# Patient Record
Sex: Male | Born: 1965
Health system: Southern US, Community
[De-identification: ages and names within clinical notes are randomized; demographics above are authoritative.]

## PROBLEM LIST (undated history)

## (undated) DIAGNOSIS — R252 Cramp and spasm: Secondary | ICD-10-CM

## (undated) DIAGNOSIS — G44009 Cluster headache syndrome, unspecified, not intractable: Secondary | ICD-10-CM

## (undated) DIAGNOSIS — K900 Celiac disease: Secondary | ICD-10-CM

## (undated) DIAGNOSIS — B019 Varicella without complication: Secondary | ICD-10-CM

## (undated) DIAGNOSIS — T7840XA Allergy, unspecified, initial encounter: Secondary | ICD-10-CM

## (undated) DIAGNOSIS — M79642 Pain in left hand: Secondary | ICD-10-CM

## (undated) DIAGNOSIS — Z8619 Personal history of other infectious and parasitic diseases: Secondary | ICD-10-CM

## (undated) DIAGNOSIS — F32A Depression, unspecified: Secondary | ICD-10-CM

## (undated) DIAGNOSIS — F329 Major depressive disorder, single episode, unspecified: Secondary | ICD-10-CM

## (undated) DIAGNOSIS — E785 Hyperlipidemia, unspecified: Secondary | ICD-10-CM

## (undated) DIAGNOSIS — Z0000 Encounter for general adult medical examination without abnormal findings: Secondary | ICD-10-CM

## (undated) DIAGNOSIS — Z800 Family history of malignant neoplasm of digestive organs: Secondary | ICD-10-CM

## (undated) HISTORY — DX: Pain in left hand: M79.642

## (undated) HISTORY — DX: Allergy, unspecified, initial encounter: T78.40XA

## (undated) HISTORY — DX: Celiac disease: K90.0

## (undated) HISTORY — DX: Cramp and spasm: R25.2

## (undated) HISTORY — PX: TONSILLECTOMY AND ADENOIDECTOMY: SUR1326

## (undated) HISTORY — DX: Depression, unspecified: F32.A

## (undated) HISTORY — DX: Major depressive disorder, single episode, unspecified: F32.9

## (undated) HISTORY — DX: Varicella without complication: B01.9

## (undated) HISTORY — DX: Family history of malignant neoplasm of digestive organs: Z80.0

## (undated) HISTORY — DX: Cluster headache syndrome, unspecified, not intractable: G44.009

## (undated) HISTORY — DX: Encounter for general adult medical examination without abnormal findings: Z00.00

## (undated) HISTORY — DX: Personal history of other infectious and parasitic diseases: Z86.19

## (undated) HISTORY — DX: Hyperlipidemia, unspecified: E78.5

---

## 2009-02-11 ENCOUNTER — Encounter: Payer: Self-pay | Admitting: Family Medicine

## 2009-08-20 HISTORY — PX: ACHILLES TENDON SURGERY: SHX542

## 2009-09-08 ENCOUNTER — Emergency Department (HOSPITAL_COMMUNITY): Admission: EM | Admit: 2009-09-08 | Discharge: 2009-09-08 | Payer: Self-pay | Admitting: Emergency Medicine

## 2010-03-07 ENCOUNTER — Ambulatory Visit: Payer: Self-pay | Admitting: Family Medicine

## 2010-03-07 DIAGNOSIS — J309 Allergic rhinitis, unspecified: Secondary | ICD-10-CM | POA: Insufficient documentation

## 2010-03-07 DIAGNOSIS — G44009 Cluster headache syndrome, unspecified, not intractable: Secondary | ICD-10-CM

## 2010-03-07 LAB — CONVERTED CEMR LAB
Ketones, urine, test strip: NEGATIVE
Specific Gravity, Urine: 1.025
pH: 5

## 2010-03-10 ENCOUNTER — Ambulatory Visit: Payer: Self-pay | Admitting: Family Medicine

## 2010-03-10 LAB — CONVERTED CEMR LAB
AST: 23 units/L (ref 0–37)
Alkaline Phosphatase: 41 units/L (ref 39–117)
Basophils Relative: 0.5 % (ref 0.0–3.0)
Chloride: 104 meq/L (ref 96–112)
Direct LDL: 195.1 mg/dL
Eosinophils Relative: 1.1 % (ref 0.0–5.0)
GFR calc non Af Amer: 90.93 mL/min (ref >60)
Glucose, Bld: 89 mg/dL (ref 70–99)
HCT: 40.3 % (ref 39.0–52.0)
MCV: 93.6 fL (ref 78.0–100.0)
Monocytes Absolute: 0.6 10*3/uL (ref 0.1–1.0)
Neutro Abs: 2.1 10*3/uL (ref 1.4–7.7)
Phosphorus: 4 mg/dL (ref 2.3–4.6)
Potassium: 5 meq/L (ref 3.5–5.1)
RBC: 4.31 M/uL (ref 4.22–5.81)
Sodium: 141 meq/L (ref 135–145)
Total Bilirubin: 0.6 mg/dL (ref 0.3–1.2)
Triglycerides: 114 mg/dL (ref 0.0–149.0)
VLDL: 22.8 mg/dL (ref 0.0–40.0)
WBC: 4.8 10*3/uL (ref 4.5–10.5)

## 2010-08-19 NOTE — Letter (Signed)
Summary: Physician's Examination of Adoption Applicant  Physician's Examination of Adoption Applicant   Imported By: Maryln Gottron 03/14/2010 12:48:00  _____________________________________________________________________  External Attachment:    Type:   Image     Comment:   External Document

## 2010-08-19 NOTE — Assessment & Plan Note (Signed)
Summary: TB READ/cb  Nurse Visit   PPD Results    Date of reading: 03/10/2010    Results: < 5mm    Interpretation: negative

## 2010-08-19 NOTE — Letter (Signed)
Summary: Records from Dr. Hilda Lias 2006 - 2010  Records from Dr. Hilda Lias 2006 - 2010   Imported By: Maryln Gottron 04/10/2010 15:03:10  _____________________________________________________________________  External Attachment:    Type:   Image     Comment:   External Document

## 2010-08-19 NOTE — Assessment & Plan Note (Signed)
Summary: NEW PT EST (REQ CPX - WILL COME IN FASTING) // RS   Vital Signs:  Patient profile:   45 year old male Height:      68.75 inches (174.63 cm) Weight:      166 pounds (75.45 kg) BMI:     24.78 O2 Sat:      98 % on Room air Temp:     98.1 degrees F (36.72 degrees C) oral Pulse rate:   77 / minute BP sitting:   118 / 86  (left arm) Cuff size:   regular  Vitals Entered By: Josph Macho RMA (March 07, 2010 8:04 AM)  O2 Flow:  Room air CC: Establish new pt/ Requesting a physical/ CF Is Patient Diabetic? No   History of Present Illness: Patient in for initial new patient appointment. No recent illness/f/c/CP/palp/SOB/GI or GU c/o. Has a Cluster HA but none recently. Has a h/o deviated spetum, broken nose and allergies so has trouble with breathing at times but none recently. He needs a doption PE forms filled out today. He and his wife have a 59 yo dughter and have had trouble conceiving again and are hoping to adopt.  Preventive Screening-Counseling & Management  Alcohol-Tobacco     Smoking Status: never      Drug Use:  no.    Current Medications (verified): 1)  None  Allergies (verified): No Known Drug Allergies  Past History:  Past Surgical History: Tonsillectomy & Adenoidectomy Left Achilles Repair, total rupture  Family History: Father: deceased near 27, MVA, colon CA in 46s, CAD, hyperlipidemia, HTN, smoker, pulmonary, neuropathy Mother: 64, A&W, hyperlipidemia Siblings:  P1/2 Brother: 24, A&W P1/2 Sister: 33, DM, obese, former smoker or drinker M1/2 Sister: 78, A&W, quit cig P1/2 Sister: 28, DM, HTN, hyperlipidemia MGM: deceased@90 , hip fracture, CAD, hyperlipidemia MGF: deceased@74 , multiple strokes, HTN, CAD, smoker, alcohol PGM: deceased older PGF: deceased older Children: Daughter: 6, A&W  Social History: Occupation: Water engineer, Museum/gallery conservator Married  Never Smoked, rare use as youngster Drug use-no uses seat belts Alcohol use-yes,  roughly 1 daily Occupation:  employed Smoking Status:  never Drug Use:  no  Review of Systems  The patient denies anorexia, fever, weight loss, weight gain, vision loss, decreased hearing, hoarseness, chest pain, syncope, dyspnea on exertion, peripheral edema, prolonged cough, headaches, hemoptysis, abdominal pain, melena, hematochezia, severe indigestion/heartburn, hematuria, incontinence, muscle weakness, suspicious skin lesions, transient blindness, difficulty walking, depression, unusual weight change, abnormal bleeding, and enlarged lymph nodes.    Physical Exam  General:  Well-developed,well-nourished,in no acute distress; alert,appropriate and cooperative throughout examination Head:  Normocephalic and atraumatic without obvious abnormalities. No apparent alopecia or balding. Eyes:  No corneal or conjunctival inflammation noted. EOMI. Perrla. Funduscopic exam benign, without hemorrhages, exudates or papilledema. Vision grossly normal. Ears:  External ear exam shows no significant lesions or deformities.  Otoscopic examination reveals clear canals, tympanic membranes are intact bilaterally without bulging, retraction, inflammation or discharge. Hearing is grossly normal bilaterally. Nose:  External nasal examination shows no deformity or inflammation. Nasal mucosa are pink and moist without lesions or exudates. Mouth:  Oral mucosa and oropharynx without lesions or exudates.  Teeth in good repair. Neck:  No deformities, masses, or tenderness noted. Chest Wall:  No deformities, masses, tenderness or gynecomastia noted. Breasts:  No masses or gynecomastia noted Lungs:  Normal respiratory effort, chest expands symmetrically. Lungs are clear to auscultation, no crackles or wheezes. Heart:  Normal rate and regular rhythm. S1 and S2 normal without gallop, murmur,  click, rub or other extra sounds. Abdomen:  Bowel sounds positive,abdomen soft and non-tender without masses, organomegaly or hernias  noted. Msk:  No deformity or scoliosis noted of thoracic or lumbar spine.   Extremities:  No clubbing, cyanosis, edema, or deformity noted with normal full range of motion of all joints.   Neurologic:  No cranial nerve deficits noted. Station and gait are normal. Plantar reflexes are down-going bilaterally. DTRs are symmetrical throughout. Sensory, motor and coordinative functions appear intact. Skin:  Intact without suspicious lesions or rashes Cervical Nodes:  No lymphadenopathy noted Psych:  Cognition and judgment appear intact. Alert and cooperative with normal attention span and concentration. No apparent delusions, illusions, hallucinations   Impression & Recommendations:  Problem # 1:  Preventive Health Care (ICD-V70.0)  Orders: TLB-Renal Function Panel (80069-RENAL) TLB-CBC Platelet - w/Differential (85025-CBCD) TLB-Hepatic/Liver Function Pnl (80076-HEPATIC) TLB-TSH (Thyroid Stimulating Hormone) (84443-TSH) TLB-Lipid Panel (80061-LIPID) Venipuncture (60454) Specimen Handling (09811) UA Dipstick w/o Micro (automated)  (81003) Forms filled out for Adoption process.  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 Counselled regarding diet, exercise and adequate sleep  Other Orders: TB Skin Test [id number]) Admin 1st Vaccine (29562)  Patient Instructions: 1)  Please schedule a follow-up appointment in 1 year for annual 2)  BMP prior to visit, ICD-9: V70.0 for all, exc lipids 3)  Hepatic Panel prior to visit ICD-9:  4)  Lipid panel prior to visit ICD-9 : 272.2 5)  TSH prior to visit ICD-9 :  6)  CBC w/ Diff prior to visit ICD-9 :  7)  Urine- dip prior to visit ICD-9 :  8)  Consider Benefiber or Metamucil  supplement daily  Preventive Care Screening  Hemoccult:    Date:  07/21/2007    Results:  historical     Orders Added: 1)  TLB-Renal Function Panel [80069-RENAL] 2)  TLB-CBC Platelet - w/Differential [85025-CBCD] 3)  TLB-Hepatic/Liver Function Pnl [80076-HEPATIC] 4)  TLB-TSH (Thyroid Stimulating Hormone) [84443-TSH] 5)  TLB-Lipid Panel [80061-LIPID] 6)  TB Skin Test [86580] 7)  Admin 1st Vaccine [90471] 8)  Venipuncture [13086] 9)  Specimen Handling [99000] 10)  UA Dipstick w/o Micro (automated)  [81003] 11)  New Patient 40-64 years [99386]    Immunizations Administered:  PPD Skin Test:    Vaccine Type: PPD    Site: left forearm    Mfr: Sanofi Pasteur    Dose: 0.1 ml    Route: ID    Given by: Josph Macho RMA    Exp. Date: 05/22/2011    Lot #: [id number]   Laboratory Results   Urine Tests  Date/Time Received: March 07, 2010   Routine Urinalysis   Color: yellow Appearance: Clear Glucose: negative   (Normal Range: Negative) Bilirubin: negative   (Normal Range: Negative) Ketone: negative   (Normal Range: Negative) Spec. Gravity: 1.025   (Normal Range: 1.003-1.035) Blood: negative   (Normal Range: Negative) pH: 5.0   (Normal Range: 5.0-8.0) Protein: negative   (Normal Range: Negative) Urobilinogen: 0.2   (Normal Range: 0-1) Nitrite: negative   (Normal Range: Negative) Leukocyte Esterace: negative   (Normal Range: Negative)    Comments: Kathrynn Speed CMA  March 07, 2010 10:15 AM

## 2011-05-05 ENCOUNTER — Ambulatory Visit (INDEPENDENT_AMBULATORY_CARE_PROVIDER_SITE_OTHER): Payer: BC Managed Care – PPO | Admitting: Family Medicine

## 2011-05-05 ENCOUNTER — Encounter: Payer: Self-pay | Admitting: Family Medicine

## 2011-05-05 DIAGNOSIS — Z0000 Encounter for general adult medical examination without abnormal findings: Secondary | ICD-10-CM

## 2011-05-05 DIAGNOSIS — Z8619 Personal history of other infectious and parasitic diseases: Secondary | ICD-10-CM | POA: Insufficient documentation

## 2011-05-05 DIAGNOSIS — B019 Varicella without complication: Secondary | ICD-10-CM | POA: Insufficient documentation

## 2011-05-05 DIAGNOSIS — J309 Allergic rhinitis, unspecified: Secondary | ICD-10-CM

## 2011-05-05 DIAGNOSIS — F329 Major depressive disorder, single episode, unspecified: Secondary | ICD-10-CM | POA: Insufficient documentation

## 2011-05-05 DIAGNOSIS — F32A Depression, unspecified: Secondary | ICD-10-CM | POA: Insufficient documentation

## 2011-05-05 DIAGNOSIS — G44009 Cluster headache syndrome, unspecified, not intractable: Secondary | ICD-10-CM

## 2011-05-05 DIAGNOSIS — E785 Hyperlipidemia, unspecified: Secondary | ICD-10-CM

## 2011-05-05 HISTORY — DX: Hyperlipidemia, unspecified: E78.5

## 2011-05-05 HISTORY — DX: Encounter for general adult medical examination without abnormal findings: Z00.00

## 2011-05-05 LAB — HEPATIC FUNCTION PANEL
ALT: 18 U/L (ref 0–53)
Total Bilirubin: 0.6 mg/dL (ref 0.3–1.2)
Total Protein: 7.4 g/dL (ref 6.0–8.3)

## 2011-05-05 LAB — RENAL FUNCTION PANEL
Calcium: 9.1 mg/dL (ref 8.4–10.5)
Chloride: 105 mEq/L (ref 96–112)
Phosphorus: 3.2 mg/dL (ref 2.3–4.6)
Potassium: 4.2 mEq/L (ref 3.5–5.1)
Sodium: 139 mEq/L (ref 135–145)

## 2011-05-05 LAB — TSH: TSH: 0.43 u[IU]/mL (ref 0.35–5.50)

## 2011-05-05 LAB — CBC
HCT: 41 % (ref 39.0–52.0)
Hemoglobin: 13.8 g/dL (ref 13.0–17.0)
MCHC: 33.7 g/dL (ref 30.0–36.0)
MCV: 91.5 fL (ref 78.0–100.0)
Platelets: 254 10*3/uL (ref 150–400)

## 2011-05-05 LAB — LIPID PANEL
Cholesterol: 249 mg/dL — ABNORMAL HIGH (ref 0–200)
HDL: 56.2 mg/dL (ref >39.00)
Total CHOL/HDL Ratio: 4
VLDL: 25.8 mg/dL (ref 0.0–40.0)

## 2011-05-05 LAB — LDL CHOLESTEROL, DIRECT: Direct LDL: 175.4 mg/dL

## 2011-05-05 NOTE — Assessment & Plan Note (Signed)
No concerns or recent episodes

## 2011-05-05 NOTE — Patient Instructions (Signed)
Preventative Care for Adults, Male MAINTAIN REGULAR HEALTH EXAMS  A routine yearly physical is a good way to check in with your primary care provider about your health and preventive screening. It is also an opportunity to share updates about your health and any concerns you have and receive a thorough all-over exam.   If you smoke or chew tobacco, find out from your caregiver how to quit. It can literally save your life, no matter how long you have been a tobacco user. If you do not use tobacco, never begin.   Maintain a healthy diet and normal weight. Increased weight leads to problems with blood pressure and diabetes. Decrease saturated fat in the diet and increase regular exercise. Get information about proper diet from your caregiver if necessary. Eat a variety of foods, including fruit, vegetables, animal or vegetable protein, such as meat, fish, chicken, and eggs, or beans, lentils, tofu, and grains, such as rice.   High blood pressure causes heart and blood vessel problems. Fat leaves deposits in your arteries that can block them. This causes heart disease and vessel disease elsewhere in your body. Check your blood pressure regularly and keep it within normal limits. Men over age 39 or those who have a family history of high blood pressure should have it checked at least every year.   Aerobic exercise helps maintain good heart health. 30 minutes of moderate-intensity exercise is recommended. For example, a brisk walk that increases your heart rate and breathing. This walk should be done on most days of the week. Persistent high blood pressure should be treated with medicine if weight loss and exercise do not work.   For many men aged 69 and older, having a cholesterol test of the blood every 5 years is recommended. If your cholesterol is found to be borderline high, or if you have heart disease or certain other medical conditions, then you may need to have it monitored more frequently.   Avoid  smoking, drinking alcohol in excess (more than two drinks per day) or use of street drugs. Do not share needles with anyone. Ask for professional help if you need assistance or instructions on stopping the use of alcohol, cigarettes, and/or drugs.   Maintain normal blood lipids and cholesterol by minimizing your intake of saturated fat. Eat a well rounded diet otherwise, with plenty of fruit and vegetables. The [employer] encourage men to eat 5-9 servings of fruit and vegetables each day. Your caregiver can help you keep your risk of heart disease or stroke at a lower level.   Ask your caregiver if you are in need of earlier testing because of: a strong family history of heart disease, or you have signs of elevated testosterone (male sex hormone) levels. This can predispose you to earlier heart disease. Ask if you should have a stress test if your history suggests this. A stress test is a test done on a treadmill that looks for heart disease. This test can find disease prior to there being a problem.   Diabetes screening assesses your blood sugar level after a fasting once every 3 years after age 21 if previous tests were normal.   Most routine colon cancer screening begins at the age of 33. On a yearly basis, doctors may provide special easy to use take-home tests to check for hidden blood in the stool. Sigmoidoscopy or colonoscopy can detect the earliest forms of colon cancer and is life saving. These test use a small camera at  the end of a tube to directly examine the colon. Speak to your caregiver about this at age 33, when routine screening begins (and is repeated every 5 years unless early forms of pre-cancerous polyps or small growths are found).   At the age of 66 men usually start screening for prostate cancer every year. Screening may begin at a younger age for those with higher risk. Those at higher risk include African-Americans or having a family history of prostate cancer.  There are two types of tests for prostate cancer:   Prostate-specific antigen (PSA) testing. Recent studies raise questions about prostate cancer using PSA and you should discuss this with your caregiver.   Digital rectal exam (in which your doctor's lubricated and gloved finger feels for enlargement of the prostate through the anus).   Practice safe sex. Use condoms. Condoms are used for birth control and to help reduce the spread of sexually transmitted infections (or STIs). Unsafe sex is having an unprotected physical relationship with someone who is bisexual, homosexual, uses intravenous street drugs, or going with someone who has sexual relations with high-risk groups. Practicing safe sex helps you avoid getting an STI. Some of the STIs are gonorrhea (the clap), chlamydia, syphilis, trichimonas, herpes, HPV (human papiloma virus) and HIV (human immunodeficiency virus) which causes AIDS. The herpes, HIV and HPV are viral illnesses that have no cure. These can result in disability, cancer and death.   It is not safe for someone who has AIDS or is HIV positive to have unprotected sex with someone else who is positive. The reason for this is the fact that there are many different strains of HIV. If you have a strain that is readily treated with medications and then suddenly introduce a strain from a partner that has no further treatment options, you may suddenly have a strain of HIV that is untreatable. Even if you are both positive for HIV, it is still necessary to practice safe sex.   Use sunscreen with a SPF (or skin protection factor) of 15 or greater. Apply sunscreen liberally and repeatedly throughout the day. Being outside in the sun when your shadow caused by the sun is shorter than you are, means you are being exposed to sun at greater intensity. Lighter skinned people are at a greater risk of skin cancer. Don't forget to also wear sunglasses in order to protect your eyes from too much damaging  sunlight. Damaging sunlight can accelerate cataract formation.   Once a month do a whole body skin exam or review, using a mirror to look at your back. Notify your caregivers of changes in moles, especially if there are changes in shapes, colors, a size larger than a pencil eraser, an irregular border, or development of new moles.   Keep carbon monoxide and smoke detectors in your home functioning at all times. Change the batteries every 6 months or use a model that plugs into the wall.   Do a monthly exam of your testicles. Gently roll each testicle between your thumb and fingers, feeling for any abnormal lumps. The best time to do this is after a hot shower or bath when the tissues are looser. Notify your caregivers of any lumps, tenderness or changes in size or shape immediately.   Stay up to date with your tetanus shots and other required immunizations. You should have a booster for tetanus every 10 years. Be sure to get your flu shot every year, since 5%-20% of the [place] population comes down with  the flu. The flu vaccine changes each year, so being vaccinated once is not enough. Get your shot in the fall, before the flu season peaks. The table below lists important vaccines to get. Other vaccines to consider include:   Hepatitis A virus (to prevent a form of infection of the liver by a virus acquired from food), Varicella Zoster (a virus that causes shingles).   Meningococcal (against bacteria which cause a form of meningitis).   Brush your teeth twice a day with fluoride toothpaste, and floss once a day. Good oral hygiene prevents tooth decay and gum disease. The problems can be painful, unattractive, and can cause other health problems. Visit your dentist for a routine oral and dental check up and preventive care every 6-12 months.   The Body Mass Index or BMI is a way of measuring how much of your body is fat. Having a BMI above 27 increases the risk of heart disease, diabetes, hypertension,  stroke and other problems related to obesity. Your caregiver can help determine your BMI and based on it develop an exercise and dietary program to help you achieve or maintain this important measurement at a healthful level.   Wear seat belts whenever in a vehicle, whether a passenger or driver, and even for very short drives of a few minutes.   If you bicycle, wear a helmet at all times.   Below is a summary of the most important preventative healthcare services that adult males should seek on a regular basis throughout their lives:  Preventative Care for Adult Males  Preventative Service Ages 83-39 Ages 22-64 Ages 28 and over  Schedule of medical visits Every 5 years Every 5 years   Schedule of dental visits Every 6-12 months Every 6-12 months   Health risk assessment and lifestyle counseling Every 3-5 years Every 3-5 years Every 3-5 years  Blood pressure check** Every 2 years Every 2 years Every 2 years  Total cholesterol check including HDL** Every 5 years beginning at age 76 Every 5 years Every 5 years through age 20, then optional.  Flexible sigmoidoscopy or colonoscopy**  Every 5 years beginning at age 58 Every 5 years through age 54, then optional.  Prostate screening  Every year beginning at age 35 Every Year  Testicular exam Monthly Monthly Monthly  FOBT (fecal occult blood test)  Every year beginning at age 76 Every year until 44, then optional.  Skin self-exam Monthly Monthly Monthly  Tetanus-diphtheria (Td) immunization Every 10 years Every 10 years Every 10 years  Influenza immunization** Every year Every year Every year  Pneumococcal immunization** Optional Optional Every 5 years  Hepatitis B immunization** Series of 3 immunizations (if not done previously, usually given at 0, 1 to 2, and 4 to 6 months) Check with your caregiver if vaccination not previously given.  Check with your caregiver if vaccination not previously given.   **Family history and personal history of risk  and conditions may change your physician's recommendations.  Document Released: 09/01/2001 Document Re-Released: 09/30/2009 [facility] Patient Information 2011 [place], [place].   Conisder MegaRed caps dialy, consider Flaxseeds ground up or Benefiber daily

## 2011-05-05 NOTE — Assessment & Plan Note (Signed)
No flares.

## 2011-05-05 NOTE — Assessment & Plan Note (Addendum)
Repeat lipids today, consider Flaxseed or Benefiber as directed. Consider MegaRed caps daily

## 2011-05-05 NOTE — Assessment & Plan Note (Signed)
Declines flu shot today, last Tetanus shot was 2006, he is asked to consider the Tdap he will return if he decides to proceed with a booster. Labs drawn today include TSH, CBC, liver, and renal panel. Has had some recent episodes of hot flashes he believes are related to psyllium but we will evaluate labs as well

## 2011-05-05 NOTE — Progress Notes (Signed)
Marc Jenkins [id number] [birth year] 05/05/2011      Progress Note New Patient  Subjective  Chief Complaint  Chief Complaint  Patient presents with  . Annual Exam    physical    HPI  Patient is 45 yo Caucasian male in today feeling well. He is in for his annual exam. He exercises regularly, tries to eat a heart healthy diet when he is at home but he travels a lot and has to eat road food frequently. He has recently started yoga. He denies any f/c/n/v/recent illness/congestion/CP/palp/SOB/GI or GU c/o. No recent HA or new concerns. No trips to any ER or MVA.  Past Medical History  Diagnosis Date  . Chicken pox as a child  . Measles as a child  . Mumps as a child  . Depression   . Allergy   . Headache, cluster   . Hyperlipidemia 05/05/2011  . Preventative health care 05/05/2011    Past Surgical History  Procedure Date  . Achilles tendon surgery 2-11    Family History  Problem Relation Age of Onset  . Ulcers Mother     in colon  . Hyperlipidemia Mother   . Cancer Father 59    colon  . Hyperlipidemia Sister   . Diabetes Sister     type 2/type 2  . Obesity Sister     History   Social History  . Marital Status: Married    Spouse Name: N/A    Number of Children: N/A  . Years of Education: N/A   Occupational History  . Not on file.   Social History Main Topics  . Smoking status: Never Smoker   . Smokeless tobacco: Never Used  . Alcohol Use: Not on file     2 glass of wine or a beer nightly  . Drug Use: No  . Sexually Active: Yes -- Male partner(s)   Other Topics Concern  . Not on file   Social History Narrative  . No narrative on file    No current outpatient prescriptions on file prior to visit.    No Known Allergies  Review of Systems  Review of Systems  Constitutional: Negative for fever, chills and malaise/fatigue.  HENT: Negative for hearing loss, nosebleeds and congestion.   Eyes: Negative for discharge.  Respiratory: Negative  for cough, sputum production, shortness of breath and wheezing.   Cardiovascular: Negative for chest pain, palpitations and leg swelling.  Gastrointestinal: Negative for heartburn, nausea, vomiting, abdominal pain, diarrhea, constipation and blood in stool.  Genitourinary: Negative for dysuria, urgency, frequency and hematuria.  Musculoskeletal: Negative for myalgias, back pain and falls.  Skin: Negative for rash.  Neurological: Negative for dizziness, tremors, sensory change, focal weakness, loss of consciousness, weakness and headaches.  Endo/Heme/Allergies: Negative for polydipsia. Does not bruise/bleed easily.  Psychiatric/Behavioral: Negative for depression and suicidal ideas. The patient is not nervous/anxious and does not have insomnia.     Objective  BP 125/83  Pulse 68  Temp(Src) 97.9 F (36.6 C) (Oral)  Ht 5' 8.75" (1.746 m)  Wt 170 lb 1.9 oz (77.166 kg)  BMI 25.31 kg/m2  SpO2 99%  Physical Exam  Physical Exam  Constitutional: He is oriented to person, place, and time and well-developed, well-nourished, and in no distress. No distress.  HENT:  Head: Normocephalic and atraumatic.  Right Ear: External ear normal.  Left Ear: External ear normal.  Nose: Nose normal.  Mouth/Throat: Oropharynx is clear and moist.  Eyes: Conjunctivae and EOM are normal. Pupils are  equal, round, and reactive to light. Right eye exhibits no discharge. Left eye exhibits no discharge. No scleral icterus.  Neck: Normal range of motion. Neck supple. No thyromegaly present.  Cardiovascular: Normal rate, regular rhythm and normal heart sounds.   No murmur heard. Pulmonary/Chest: Effort normal and breath sounds normal. No respiratory distress. He has no wheezes. He has no rales. He exhibits no tenderness.  Abdominal: He exhibits no distension and no mass. There is no tenderness. There is no rebound and no guarding.  Musculoskeletal: He exhibits no edema.  Lymphadenopathy:    He has no cervical  adenopathy.  Neurological: He is alert and oriented to person, place, and time.  Skin: Skin is warm and dry. He is not diaphoretic. No erythema.  Psychiatric: Memory, affect and judgment normal.       Assessment & Plan  Hyperlipidemia Repeat lipids today, consider Flaxseed or Benefiber as directed. Consider MegaRed caps daily  CLUSTER HEADACHE SYNDROME UNSPECIFIED No concerns or recent episodes  ALLERGIC RHINITIS CAUSE UNSPECIFIED No flares  Preventative health care Declines flu shot today, last Tetanus shot was 2006, he is asked to consider the Tdap he will return if he decides to proceed with a booster. Labs drawn today include TSH, CBC, liver, and renal panel. Has had some recent episodes of hot flashes he believes are related to psyllium but we will evaluate labs as well

## 2011-10-02 ENCOUNTER — Telehealth: Payer: Self-pay

## 2011-10-02 NOTE — Telephone Encounter (Signed)
Per MD pt needs a physical to have adoption paperwork filled out? Per pt he had his last physical on 05-05-11. Patient states insurance is not going to pay for another one until mid October? Please advise? Pt aware that MD is out of the office until Monday.

## 2011-10-05 NOTE — Telephone Encounter (Signed)
Spouse informed.

## 2011-10-05 NOTE — Telephone Encounter (Signed)
Let them know I missed the PE in my quick chart review happy to do hte paperwork

## 2011-10-29 ENCOUNTER — Telehealth: Payer: Self-pay

## 2011-10-29 NOTE — Telephone Encounter (Signed)
Patients [facility] paperwork was received and put on MD's desk to be filled out and will fax to [phone]

## 2011-11-10 ENCOUNTER — Ambulatory Visit (INDEPENDENT_AMBULATORY_CARE_PROVIDER_SITE_OTHER): Payer: BC Managed Care – PPO

## 2011-11-10 DIAGNOSIS — Z23 Encounter for immunization: Secondary | ICD-10-CM

## 2011-11-10 NOTE — Progress Notes (Signed)
  Subjective:    Patient ID: Marc Jenkins, male    DOB: [birth year], 46 y.o.   MRN: [id number]  HPI    Review of Systems     Objective:   Physical Exam        Assessment & Plan:  Patient came in for TB placement. Pt will come back Thursday morning to have TB reading.

## 2012-05-04 ENCOUNTER — Encounter: Payer: Self-pay | Admitting: Family Medicine

## 2012-05-04 ENCOUNTER — Ambulatory Visit (INDEPENDENT_AMBULATORY_CARE_PROVIDER_SITE_OTHER): Payer: BC Managed Care – PPO | Admitting: Family Medicine

## 2012-05-04 VITALS — BP 112/71 | HR 62 | Temp 97.4°F | Ht 68.75 in | Wt 167.4 lb

## 2012-05-04 DIAGNOSIS — F329 Major depressive disorder, single episode, unspecified: Secondary | ICD-10-CM

## 2012-05-04 DIAGNOSIS — Z0000 Encounter for general adult medical examination without abnormal findings: Secondary | ICD-10-CM

## 2012-05-04 DIAGNOSIS — E785 Hyperlipidemia, unspecified: Secondary | ICD-10-CM

## 2012-05-04 DIAGNOSIS — J309 Allergic rhinitis, unspecified: Secondary | ICD-10-CM

## 2012-05-04 DIAGNOSIS — Z800 Family history of malignant neoplasm of digestive organs: Secondary | ICD-10-CM

## 2012-05-04 DIAGNOSIS — M79642 Pain in left hand: Secondary | ICD-10-CM

## 2012-05-04 DIAGNOSIS — M79609 Pain in unspecified limb: Secondary | ICD-10-CM

## 2012-05-04 HISTORY — DX: Family history of malignant neoplasm of digestive organs: Z80.0

## 2012-05-04 HISTORY — DX: Pain in left hand: M79.642

## 2012-05-04 LAB — RENAL FUNCTION PANEL
Albumin: 4.1 g/dL (ref 3.5–5.2)
BUN: 16 mg/dL (ref 6–23)
Chloride: 105 mEq/L (ref 96–112)
Creatinine, Ser: 1.1 mg/dL (ref 0.4–1.5)
Glucose, Bld: 77 mg/dL (ref 70–99)
Phosphorus: 3.6 mg/dL (ref 2.3–4.6)

## 2012-05-04 LAB — CBC
HCT: 42.2 % (ref 39.0–52.0)
Hemoglobin: 13.9 g/dL (ref 13.0–17.0)
MCHC: 32.9 g/dL (ref 30.0–36.0)
MCV: 93.8 fl (ref 78.0–100.0)
RDW: 13.9 % (ref 11.5–14.6)
WBC: 4.6 10*3/uL (ref 4.5–10.5)

## 2012-05-04 LAB — LIPID PANEL
HDL: 58.9 mg/dL (ref >39.00)
Total CHOL/HDL Ratio: 4
VLDL: 16.4 mg/dL (ref 0.0–40.0)

## 2012-05-04 LAB — LDL CHOLESTEROL, DIRECT: Direct LDL: 201.4 mg/dL

## 2012-05-04 LAB — HEPATIC FUNCTION PANEL
Albumin: 4.1 g/dL (ref 3.5–5.2)
Alkaline Phosphatase: 40 U/L (ref 39–117)
Bilirubin, Direct: 0 mg/dL (ref 0.0–0.3)
Total Bilirubin: 0.6 mg/dL (ref 0.3–1.2)

## 2012-05-04 LAB — TSH: TSH: 1.19 u[IU]/mL (ref 0.35–5.50)

## 2012-05-04 NOTE — Assessment & Plan Note (Signed)
Encouraged to try ice, Aspercreme and splinting qhs if he would like, is using a topical cream called Somba. Working a [employer]

## 2012-05-04 NOTE — Assessment & Plan Note (Signed)
Has stopped [employer] and has started a product called NevaTron and he feels he is more clear and energetic.

## 2012-05-04 NOTE — Progress Notes (Signed)
Patient ID: Marc Jenkins, male   DOB: 1966/06/15, 46 y.o.   MRN: [id number] Garry Nicolini [id number] July 09, 1966 05/04/2012      Progress Note New Patient  Subjective  Chief Complaint  Chief Complaint  Patient presents with  . Annual Exam    physical    HPI  Patient is a 36 Caucasian male who is here today for annual exam. Overall he is doing very well. He denies any recent illness, fevers, chest pain, palpitations, GI or GU complaints. He does know when he is walking occasionally feels slightly short of breath but other times when he exercises he has no difficulty. He has no associated symptoms cough or wheezing. She has recently switched to by mouth diet and has lost 2 pounds and says he feels better. They stopped [employer]. John's wort and have switched him to a new naturopathic product through his chiropractor called [facility]. Feels it is helping somewhat and his head feels more clear. He has an appointment tomorrow with dermatology for an annual skin mapping this will be his first visit. He has no acute concerns. He does struggle with some low-grade chronic pain which he sees a [facility]. Some low back pain intermittently as well as left shoulder and left hand pain. Describes stiffness and pain at the base of his hand did suffer an injury there about 10 years ago. No swelling or redness. No warmth.  Past Medical History  Diagnosis Date  . Chicken pox as a child  . Measles as a child  . Mumps as a child  . Depression   . Allergy   . Headache, cluster   . Hyperlipidemia 05/05/2011  . Preventative health care 05/05/2011  . Left hand pain 05/04/2012  . FH: colon cancer 05/04/2012    Past Surgical History  Procedure Date  . Achilles tendon surgery 2-11    Family History  Problem Relation Age of Onset  . Ulcers Mother     in colon  . Hyperlipidemia Mother   . Cancer Father 82    colon  . Hyperlipidemia Sister   . Diabetes Sister     type 2/type 2  . Obesity Sister       History   Social History  . Marital Status: Married    Spouse Name: N/A    Number of Children: N/A  . Years of Education: N/A   Occupational History  . Not on file.   Social History Main Topics  . Smoking status: Never Smoker   . Smokeless tobacco: Never Used  . Alcohol Use: Not on file     2 glass of wine or a beer nightly  . Drug Use: No  . Sexually Active: Yes -- Male partner(s)   Other Topics Concern  . Not on file   Social History Narrative  . No narrative on file    Current Outpatient Prescriptions on File Prior to Visit  Medication Sig Dispense Refill  . fish oil-omega-3 fatty acids 1000 MG capsule Take 2 g by mouth daily.          No Known Allergies  Review of Systems  Review of Systems  Constitutional: Negative for fever, chills and malaise/fatigue.  HENT: Negative for hearing loss, nosebleeds and congestion.   Eyes: Negative for discharge.  Respiratory: Negative for cough, sputum production, shortness of breath and wheezing.   Cardiovascular: Negative for chest pain, palpitations and leg swelling.  Gastrointestinal: Negative for heartburn, nausea, vomiting, abdominal pain, diarrhea, constipation and blood  in stool.  Genitourinary: Negative for dysuria, urgency, frequency and hematuria.  Musculoskeletal: Positive for back pain and joint pain. Negative for myalgias and falls.       Intermittent low back pain, left shoulder and left hand pain, follows with a chiropractor  Skin: Negative for rash.  Neurological: Negative for dizziness, tremors, sensory change, focal weakness, loss of consciousness, weakness and headaches.  Endo/Heme/Allergies: Negative for polydipsia. Does not bruise/bleed easily.  Psychiatric/Behavioral: Negative for depression and suicidal ideas. The patient is not nervous/anxious and does not have insomnia.     Objective  BP 112/71  Pulse 62  Temp 97.4 F (36.3 C) (Temporal)  Ht 5' 8.75" (1.746 m)  Wt 167 lb 6.4 oz (75.932  kg)  BMI 24.90 kg/m2  SpO2 96%  Physical Exam  Physical Exam  Constitutional: He is oriented to person, place, and time and well-developed, well-nourished, and in no distress. No distress.  HENT:  Head: Normocephalic and atraumatic.  Eyes: Conjunctivae normal are normal.  Neck: Neck supple. No thyromegaly present.  Cardiovascular: Normal rate, regular rhythm and normal heart sounds.   No murmur heard. Pulmonary/Chest: Effort normal and breath sounds normal. No respiratory distress.  Abdominal: He exhibits no distension and no mass. There is no tenderness.  Musculoskeletal: He exhibits no edema.  Neurological: He is alert and oriented to person, place, and time.  Skin: Skin is warm.  Psychiatric: Memory, affect and judgment normal.       Assessment & Plan  Depression Has stopped St John's Wort and has started a product called NevaTron and he feels he is more clear and energetic.  Hyperlipidemia Will repeat lipid panel today. He has switched to [facility] Liver Oil caps daily  Left hand pain Encouraged to try ice, Aspercreme and splinting qhs if he would like, is using a topical cream called Somba. 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 Needs booster of Tdap if injured or in 2016. Following with a chiropractor. Using a Paleo based diet and exercising well  FH: colon cancer Father deceased at 28 of colon cancer  ALLERGIC RHINITIS CAUSE UNSPECIFIED Worse than usual this year. Uses Claritin prn rarely. Consider nasal saline. Consider Afrin prn prior to flying if allergies flared up

## 2012-05-04 NOTE — Assessment & Plan Note (Addendum)
Declines flu shot today, may return for one if so chooses. Needs booster of Tdap if injured or in 2016. Following with a chiropractor. Using a Paleo based diet and exercising well

## 2012-05-04 NOTE — Patient Instructions (Addendum)
Preventive Care for Adults, Male A healthy lifestyle and preventive care can promote health and wellness. Preventive health guidelines for men include the following key practices:  A routine yearly physical is a good way to check with your caregiver about your health and preventative screening. It is a chance to share any concerns and updates on your health, and to receive a thorough exam.  Visit your dentist for a routine exam and preventative care every 6 months. Brush your teeth twice a day and floss once a day. Good oral hygiene prevents tooth decay and gum disease.  The frequency of eye exams is based on your age, health, family medical history, use of contact lenses, and other factors. Follow your caregiver's recommendations for frequency of eye exams.  Eat a healthy diet. Foods like vegetables, fruits, whole grains, low-fat dairy products, and lean protein foods contain the nutrients you need without too many calories. Decrease your intake of foods high in solid fats, added sugars, and salt. Eat the right amount of calories for you.Get information about a proper diet from your caregiver, if necessary.  Regular physical exercise is one of the most important things you can do for your health. Most adults should get at least 150 minutes of moderate-intensity exercise (any activity that increases your heart rate and causes you to sweat) each week. In addition, most adults need muscle-strengthening exercises on 2 or more days a week.  Maintain a healthy weight. The body mass index (BMI) is a screening tool to identify possible weight problems. It provides an estimate of body fat based on height and weight. Your caregiver can help determine your BMI, and can help you achieve or maintain a healthy weight.For adults 20 years and older:  A BMI below 18.5 is considered underweight.  A BMI of 18.5 to 24.9 is normal.  A BMI of 25 to 29.9 is considered overweight.  A BMI of 30 and above is  considered obese.  Maintain normal blood lipids and cholesterol levels by exercising and minimizing your intake of saturated fat. Eat a balanced diet with plenty of fruit and vegetables. Blood tests for lipids and cholesterol should begin at age 20 and be repeated every 5 years. If your lipid or cholesterol levels are high, you are over 50, or you are a high risk for heart disease, you may need your cholesterol levels checked more frequently.Ongoing high lipid and cholesterol levels should be treated with medicines if diet and exercise are not effective.  If you smoke, find out from your caregiver how to quit. If you do not use tobacco, do not start.  If you choose to drink alcohol, do not exceed 2 drinks per day. One drink is considered to be 12 ounces (355 mL) of beer, 5 ounces (148 mL) of wine, or 1.5 ounces (44 mL) of liquor.  Avoid use of street drugs. Do not share needles with anyone. Ask for help if you need support or instructions about stopping the use of drugs.  High blood pressure causes heart disease and increases the risk of stroke. Your blood pressure should be checked at least every 1 to 2 years. Ongoing high blood pressure should be treated with medicines, if weight loss and exercise are not effective.  If you are 45 to 47 years old, ask your caregiver if you should take aspirin to prevent heart disease.  Diabetes screening involves taking a blood sample to check your fasting blood sugar level. This should be done once every 3 years,   after age 45, if you are within normal weight and without risk factors for diabetes. Testing should be considered at a younger age or be carried out more frequently if you are overweight and have at least 1 risk factor for diabetes.  Colorectal cancer can be detected and often prevented. Most routine colorectal cancer screening begins at the age of 50 and continues through age 75. However, your caregiver may recommend screening at an earlier age if you  have risk factors for colon cancer. On a yearly basis, your caregiver may provide home test kits to check for hidden blood in the stool. Use of a small camera at the end of a tube, to directly examine the colon (sigmoidoscopy or colonoscopy), can detect the earliest forms of colorectal cancer. Talk to your caregiver about this at age 50, when routine screening begins. Direct examination of the colon should be repeated every 5 to 10 years through age 75, unless early forms of pre-cancerous polyps or small growths are found.  Hepatitis C blood testing is recommended for all people born from 1945 through 1965 and any individual with known risks for hepatitis C.  Practice safe sex. Use condoms and avoid high-risk sexual practices to reduce the spread of sexually transmitted infections (STIs). STIs include gonorrhea, chlamydia, syphilis, trichomonas, herpes, HPV, and human immunodeficiency virus (HIV). Herpes, HIV, and HPV are viral illnesses that have no cure. They can result in disability, cancer, and death.  A one-time screening for abdominal aortic aneurysm (AAA) and surgical repair of large AAAs by sound wave imaging (ultrasonography) is recommended for ages 65 to 75 years who are current or former smokers.  Healthy men should no longer receive prostate-specific antigen (PSA) blood tests as part of routine cancer screening. Consult with your caregiver about prostate cancer screening.  Testicular cancer screening is not recommended for adult males who have no symptoms. Screening includes self-exam, caregiver exam, and other screening tests. Consult with your caregiver about any symptoms you have or any concerns you have about testicular cancer.  Use sunscreen with skin protection factor (SPF) of 30 or more. Apply sunscreen liberally and repeatedly throughout the day. You should seek shade when your shadow is shorter than you. Protect yourself by wearing long sleeves, pants, a wide-brimmed hat, and  sunglasses year round, whenever you are outdoors.  Once a month, do a whole body skin exam, using a mirror to look at the skin on your back. Notify your caregiver of new moles, moles that have irregular borders, moles that are larger than a pencil eraser, or moles that have changed in shape or color.  Stay current with required immunizations.  Influenza. You need a dose every fall (or winter). The composition of the flu vaccine changes each year, so being vaccinated once is not enough.  Pneumococcal polysaccharide. You need 1 to 2 doses if you smoke cigarettes or if you have certain chronic medical conditions. You need 1 dose at age 65 (or older) if you have never been vaccinated.  Tetanus, diphtheria, pertussis (Tdap, Td). Get 1 dose of Tdap vaccine if you are younger than age 65 years, are over 65 and have contact with an infant, are a healthcare worker, or simply want to be protected from whooping cough. After that, you need a Td booster dose every 10 years. Consult your caregiver if you have not had at least 3 tetanus and diphtheria-containing shots sometime in your life or have a deep or dirty wound.  HPV. This vaccine is recommended   for males 13 through 47 years of age. This vaccine may be given to men 22 through 47 years of age who have not completed the 3 dose series. It is recommended for men through age 26 who have sex with men or whose immune system is weakened because of HIV infection, other illness, or medications. The vaccine is given in 3 doses over 6 months.  Measles, mumps, rubella (MMR). You need at least 1 dose of MMR if you were born in 1957 or later. You may also need a 2nd dose.  Meningococcal. If you are age 19 to 21 years and a first-year college student living in a residence hall, or have one of several medical conditions, you need to get vaccinated against meningococcal disease. You may also need additional booster doses.  Zoster (shingles). If you are age 60 years or  older, you should get this vaccine.  Varicella (chickenpox). If you have never had chickenpox or you were vaccinated but received only 1 dose, talk to your caregiver to find out if you need this vaccine.  Hepatitis A. You need this vaccine if you have a specific risk factor for hepatitis A virus infection, or you simply wish to be protected from this disease. The vaccine is usually given as 2 doses, 6 to 18 months apart.  Hepatitis B. You need this vaccine if you have a specific risk factor for hepatitis B virus infection or you simply wish to be protected from this disease. The vaccine is given in 3 doses, usually over 6 months. Preventative Service / Frequency Ages 19 to 39  Blood pressure check.** / Every 1 to 2 years.  Lipid and cholesterol check.** / Every 5 years beginning at age 20.  Hepatitis C blood test.** / For any individual with known risks for hepatitis C.  Skin self-exam. / Monthly.  Influenza immunization.** / Every year.  Pneumococcal polysaccharide immunization.** / 1 to 2 doses if you smoke cigarettes or if you have certain chronic medical conditions.  Tetanus, diphtheria, pertussis (Tdap,Td) immunization. / A one-time dose of Tdap vaccine. After that, you need a Td booster dose every 10 years.  HPV immunization. / 3 doses over 6 months, if 26 and younger.  Measles, mumps, rubella (MMR) immunization. / You need at least 1 dose of MMR if you were born in 1957 or later. You may also need a 2nd dose.  Meningococcal immunization. / 1 dose if you are age 19 to 21 years and a first-year college student living in a residence hall, or have one of several medical conditions, you need to get vaccinated against meningococcal disease. You may also need additional booster doses.  Varicella immunization.** / Consult your caregiver.  Hepatitis A immunization.** / Consult your caregiver. 2 doses, 6 to 18 months apart.  Hepatitis B immunization.** / Consult your caregiver. 3 doses  usually over 6 months. Ages 40 to 64  Blood pressure check.** / Every 1 to 2 years.  Lipid and cholesterol check.** / Every 5 years beginning at age 20.  Fecal occult blood test (FOBT) of stool. / Every year beginning at age 50 and continuing until age 75. You may not have to do this test if you get colonoscopy every 10 years.  Flexible sigmoidoscopy** or colonoscopy.** / Every 5 years for a flexible sigmoidoscopy or every 10 years for a colonoscopy beginning at age 50 and continuing until age 75.  Hepatitis C blood test.** / For all people born from 1945 through 1965 and any   individual with known risks for hepatitis C.  Skin self-exam. / Monthly.  Influenza immunization.** / Every year.  Pneumococcal polysaccharide immunization.** / 1 to 2 doses if you smoke cigarettes or if you have certain chronic medical conditions.  Tetanus, diphtheria, pertussis (Tdap/Td) immunization.** / A one-time dose of Tdap vaccine. After that, you need a Td booster dose every 10 years.  Measles, mumps, rubella (MMR) immunization. / You need at least 1 dose of MMR if you were born in 1957 or later. You may also need a 2nd dose.  Varicella immunization.**/ Consult your caregiver.  Meningococcal immunization.** / Consult your caregiver.  Hepatitis A immunization.** / Consult your caregiver. 2 doses, 6 to 18 months apart.  Hepatitis B immunization.** / Consult your caregiver. 3 doses, usually over 6 months. Ages 65 and over  Blood pressure check.** / Every 1 to 2 years.  Lipid and cholesterol check.**/ Every 5 years beginning at age 20.  Fecal occult blood test (FOBT) of stool. / Every year beginning at age 50 and continuing until age 75. You may not have to do this test if you get colonoscopy every 10 years.  Flexible sigmoidoscopy** or colonoscopy.** / Every 5 years for a flexible sigmoidoscopy or every 10 years for a colonoscopy beginning at age 50 and continuing until age 75.  Hepatitis C blood  test.** / For all people born from 1945 through 1965 and any individual with known risks for hepatitis C.  Abdominal aortic aneurysm (AAA) screening.** / A one-time screening for ages 65 to 75 years who are current or former smokers.  Skin self-exam. / Monthly.  Influenza immunization.** / Every year.  Pneumococcal polysaccharide immunization.** / 1 dose at age 65 (or older) if you have never been vaccinated.  Tetanus, diphtheria, pertussis (Tdap, Td) immunization. / A one-time dose of Tdap vaccine if you are over 65 and have contact with an infant, are a healthcare worker, or simply want to be protected from whooping cough. After that, you need a Td booster dose every 10 years.  Varicella immunization. ** / Consult your caregiver.  Meningococcal immunization.** / Consult your caregiver.  Hepatitis A immunization. ** / Consult your caregiver. 2 doses, 6 to 18 months apart.  Hepatitis B immunization.** / Check with your caregiver. 3 doses, usually over 6 months. **Family history and personal history of risk and conditions may change your caregiver's recommendations. Document Released: 09/01/2001 Document Revised: 09/28/2011 Document Reviewed: 12/01/2010 [employer] Patient Information 2013 [employer].  

## 2012-05-04 NOTE — Assessment & Plan Note (Signed)
Father deceased at 64 of colon cancer

## 2012-05-04 NOTE — Assessment & Plan Note (Signed)
Will repeat lipid panel today. He has switched to [facility] Liver Oil caps daily

## 2012-05-04 NOTE — Assessment & Plan Note (Signed)
Worse than usual this year. Uses Claritin prn rarely. Consider nasal saline. Consider Afrin prn prior to flying if allergies flared up

## 2012-05-09 ENCOUNTER — Other Ambulatory Visit: Payer: BC Managed Care – PPO

## 2012-05-17 ENCOUNTER — Encounter: Payer: BC Managed Care – PPO | Admitting: Family Medicine

## 2012-09-03 ENCOUNTER — Other Ambulatory Visit: Payer: Self-pay

## 2013-05-02 ENCOUNTER — Telehealth: Payer: Self-pay

## 2013-05-02 DIAGNOSIS — E785 Hyperlipidemia, unspecified: Secondary | ICD-10-CM

## 2013-05-02 DIAGNOSIS — Z0000 Encounter for general adult medical examination without abnormal findings: Secondary | ICD-10-CM

## 2013-05-02 NOTE — Telephone Encounter (Signed)
OK to add labs for preventative and hi cholesterol. Lipid renal, cbc, tsh, hepatic  Prior to visit

## 2013-05-02 NOTE — Telephone Encounter (Signed)
Pt called stating that he has an appt on Friday and would like to know if he should come in tomorrow or Thursday for labs before the appt?  Please advise?

## 2013-05-03 NOTE — Telephone Encounter (Signed)
Notified pt. He states he will complete labs tomorrow, orders entered as below.

## 2013-05-04 LAB — LIPID PANEL
Cholesterol: 243 mg/dL — ABNORMAL HIGH (ref 0–200)
HDL: 60 mg/dL (ref >39)
LDL Cholesterol: 162 mg/dL — ABNORMAL HIGH (ref 0–99)
Total CHOL/HDL Ratio: 4.1 Ratio
Triglycerides: 104 mg/dL (ref <150)
VLDL: 21 mg/dL (ref 0–40)

## 2013-05-04 LAB — HEPATIC FUNCTION PANEL
AST: 19 U/L (ref 0–37)
Alkaline Phosphatase: 39 U/L (ref 39–117)
Total Bilirubin: 0.6 mg/dL (ref 0.3–1.2)

## 2013-05-04 LAB — RENAL FUNCTION PANEL
Albumin: 4.4 g/dL (ref 3.5–5.2)
BUN: 12 mg/dL (ref 6–23)
CO2: 28 mEq/L (ref 19–32)
Calcium: 9.5 mg/dL (ref 8.4–10.5)
Chloride: 106 mEq/L (ref 96–112)
Creat: 1 mg/dL (ref 0.50–1.35)
Glucose, Bld: 86 mg/dL (ref 70–99)
Phosphorus: 3.8 mg/dL (ref 2.3–4.6)
Potassium: 4.6 mEq/L (ref 3.5–5.3)
Sodium: 139 mEq/L (ref 135–145)

## 2013-05-04 LAB — CBC
HCT: 39.9 % (ref 39.0–52.0)
Hemoglobin: 13.7 g/dL (ref 13.0–17.0)
MCH: 31.6 pg (ref 26.0–34.0)
MCHC: 34.3 g/dL (ref 30.0–36.0)
MCV: 92.1 fL (ref 78.0–100.0)
Platelets: 252 10*3/uL (ref 150–400)
RBC: 4.33 MIL/uL (ref 4.22–5.81)
RDW: 13.8 % (ref 11.5–15.5)
WBC: 4.4 10*3/uL (ref 4.0–10.5)

## 2013-05-04 LAB — TSH: TSH: 2.125 u[IU]/mL (ref 0.350–4.500)

## 2013-05-05 ENCOUNTER — Encounter: Payer: BC Managed Care – PPO | Admitting: Family Medicine

## 2013-05-05 ENCOUNTER — Encounter: Payer: Self-pay | Admitting: Family Medicine

## 2013-05-05 ENCOUNTER — Ambulatory Visit (INDEPENDENT_AMBULATORY_CARE_PROVIDER_SITE_OTHER): Payer: BC Managed Care – PPO | Admitting: Family Medicine

## 2013-05-05 VITALS — BP 123/77 | HR 64 | Temp 98.4°F | Ht 68.5 in | Wt 169.5 lb

## 2013-05-05 DIAGNOSIS — J309 Allergic rhinitis, unspecified: Secondary | ICD-10-CM

## 2013-05-05 DIAGNOSIS — E785 Hyperlipidemia, unspecified: Secondary | ICD-10-CM

## 2013-05-05 DIAGNOSIS — Z0000 Encounter for general adult medical examination without abnormal findings: Secondary | ICD-10-CM

## 2013-05-05 NOTE — Patient Instructions (Addendum)
[facility] is the lab company, will they cover zinc or Vitamin D checks for nail changes/abnormalities [email or web address]  Cholesterol Cholesterol is a white, waxy, fat-like protein needed by your body in small amounts. The liver makes all the cholesterol you need. It is carried from the liver by the blood through the blood vessels. Deposits (plaque) may build up on blood vessel walls. This makes the arteries narrower and stiffer. Plaque increases the risk for heart attack and stroke. You cannot feel your cholesterol level even if it is very high. The only way to know is by a blood test to check your lipid (fats) levels. Once you know your cholesterol levels, you should keep a record of the test results. Work with your caregiver to to keep your levels in the desired range. WHAT THE RESULTS MEAN:  Total cholesterol is a rough measure of all the cholesterol in your blood.  LDL is the so-called bad cholesterol. This is the type that deposits cholesterol in the walls of the arteries. You want this level to be low.  HDL is the good cholesterol because it cleans the arteries and carries the LDL away. You want this level to be high.  Triglycerides are fat that the body can either burn for energy or store. High levels are closely linked to heart disease. DESIRED LEVELS:  Total cholesterol below 200.  LDL below 100 for people at risk, below 70 for very high risk.  HDL above 50 is good, above 60 is best.  Triglycerides below 150. HOW TO LOWER YOUR CHOLESTEROL:  Diet.  Choose fish or white meat chicken and [place], roasted or baked. Limit fatty cuts of red meat, fried foods, and processed meats, such as sausage and lunch meat.  Eat lots of fresh fruits and vegetables. Choose whole grains, beans, pasta, potatoes and cereals.  Use only small amounts of olive, corn or canola oils. Avoid butter, mayonnaise, shortening or palm kernel oils. Avoid foods with trans-fats.  Use skim/nonfat milk and  low-fat/nonfat yogurt and cheeses. Avoid whole milk, cream, ice cream, egg yolks and cheeses. Healthy desserts include angel food cake, ginger snaps, animal crackers, hard candy, popsicles, and low-fat/nonfat frozen yogurt. Avoid pastries, cakes, pies and cookies.  Exercise.  A regular program helps decrease LDL and raises HDL.  Helps with weight control.  Do things that increase your activity level like gardening, walking, or taking the stairs.  Medication.  May be prescribed by your caregiver to help lowering cholesterol and the risk for heart disease.  You may need medicine even if your levels are normal if you have several risk factors. HOME CARE INSTRUCTIONS   Follow your diet and exercise programs as suggested by your caregiver.  Take medications as directed.  Have blood work done when your caregiver feels it is necessary. MAKE SURE YOU:   Understand these instructions.  Will watch your condition.  Will get help right away if you are not doing well or get worse. Document Released: 03/31/2001 Document Revised: 09/28/2011 Document Reviewed: 09/21/2007 [facility] Patient Information 2014 [place], [place].

## 2013-05-05 NOTE — Progress Notes (Signed)
Patient ID: Marc Jenkins, male   DOB: 11/07/1965, 47 y.o.   MRN: [id number] Marc Jenkins [id number] 1965/10/08 05/05/2013      Progress Note-Follow Up  Subjective  Chief Complaint  Chief Complaint  Patient presents with  . Annual Exam    HPI  Patient is a 47 year old Caucasian male who is in today for annual exam. Feeling well. Is maintaining a heart healthy diet and avoiding gluten. Does note his bowel habits have become more consistent and more firm. No bloody or tarry stool. He has to travel a great deal at work and sometimes is unable to eat the way he would like to know if his symptoms worsen. His allergies have been fairly well controlled this year. He offers no significant new complaints. His following with a chiropractor and taking numerous supplements. Including Chloroplex, a Multivitamin, a cholesterol and anxiety supplement. Denies recent illness, chest pain, palpitations or shortness of breath.  Past Medical History  Diagnosis Date  . Chicken pox as a child  . Measles as a child  . Mumps as a child  . Depression   . Allergy   . Headache, cluster   . Hyperlipidemia 05/05/2011  . Preventative health care 05/05/2011  . Left hand pain 05/04/2012  . FH: colon cancer 05/04/2012    Past Surgical History  Procedure Laterality Date  . Achilles tendon surgery  2-11    Family History  Problem Relation Age of Onset  . Ulcers Mother     in colon  . Hyperlipidemia Mother   . Cancer Father 106    colon  . Hyperlipidemia Sister   . Diabetes Sister     type 2/type 2  . Obesity Sister     History   Social History  . Marital Status: Married    Spouse Name: N/A    Number of Children: N/A  . Years of Education: N/A   Occupational History  . Not on file.   Social History Main Topics  . Smoking status: Never Smoker   . Smokeless tobacco: Never Used  . Alcohol Use: Not on file     Comment: 2 glass of wine or a beer nightly  . Drug Use: No  . Sexual  Activity: Yes    Partners: Female   Other Topics Concern  . Not on file   Social History Narrative  . No narrative on file    Current Outpatient Prescriptions on File Prior to Visit  Medication Sig Dispense Refill  . COD LIVER OIL PO Take by mouth.      . fish oil-omega-3 fatty acids 1000 MG capsule Take 2 g by mouth daily.         No current facility-administered medications on file prior to visit.    No Known Allergies  Review of Systems  Review of Systems  Constitutional: Negative for fever and malaise/fatigue.  HENT: Negative for congestion.   Eyes: Negative for discharge.  Respiratory: Negative for shortness of breath.   Cardiovascular: Negative for chest pain, palpitations and leg swelling.  Gastrointestinal: Negative for nausea, abdominal pain and diarrhea.  Genitourinary: Negative for dysuria.  Musculoskeletal: Negative for falls.  Skin: Negative for rash.  Neurological: Negative for loss of consciousness and headaches.  Endo/Heme/Allergies: Negative for polydipsia.  Psychiatric/Behavioral: Negative for depression and suicidal ideas. The patient is not nervous/anxious and does not have insomnia.     Objective  BP 123/77  Pulse 64  Temp(Src) 98.4 F (36.9 C) (Oral)  Ht 5'  8.5" (1.74 m)  Wt 169 lb 8 oz (76.885 kg)  BMI 25.39 kg/m2  SpO2 95%  Physical Exam  Physical Exam  Constitutional: He is oriented to person, place, and time and well-developed, well-nourished, and in no distress. No distress.  HENT:  Head: Normocephalic and atraumatic.  Eyes: Conjunctivae are normal.  Neck: Neck supple. No thyromegaly present.  Cardiovascular: Normal rate, regular rhythm and normal heart sounds.   No murmur heard. Pulmonary/Chest: Effort normal and breath sounds normal. No respiratory distress.  Abdominal: He exhibits no distension and no mass. There is no tenderness.  Musculoskeletal: He exhibits no edema.  Neurological: He is alert and oriented to person, place,  and time.  Skin: Skin is warm.  Psychiatric: Memory, affect and judgment normal.    Lab Results  Component Value Date   TSH 2.125 05/03/2013   Lab Results  Component Value Date   WBC 4.4 05/03/2013   HGB 13.7 05/03/2013   HCT 39.9 05/03/2013   MCV 92.1 05/03/2013   PLT 252 05/03/2013   Lab Results  Component Value Date   CREATININE 1.00 05/03/2013   BUN 12 05/03/2013   NA 139 05/03/2013   K 4.6 05/03/2013   CL 106 05/03/2013   CO2 28 05/03/2013   Lab Results  Component Value Date   ALT 19 05/03/2013   AST 19 05/03/2013   ALKPHOS 39 05/03/2013   BILITOT 0.6 05/03/2013   Lab Results  Component Value Date   CHOL 243* 05/03/2013   Lab Results  Component Value Date   HDL 60 05/03/2013   Lab Results  Component Value Date   LDLCALC 162* 05/03/2013   Lab Results  Component Value Date   TRIG 104 05/03/2013   Lab Results  Component Value Date   CHOLHDL 4.1 05/03/2013     Assessment & Plan  Hyperlipidemia Improved with dietary changes. Has been avoiding gluten and trying  To eat a heart healthy diet. Continue same  Preventative health care Declines flu shot today, encouraged ongoing heart healthy diet and an increase in regular exercise. Reviewed annual labs with patient.  ALLERGIC RHINITIS CAUSE UNSPECIFIED No recent flares.

## 2013-05-07 NOTE — Assessment & Plan Note (Signed)
Improved with dietary changes. Has been avoiding gluten and trying  To eat a heart healthy diet. Continue same

## 2013-05-07 NOTE — Assessment & Plan Note (Signed)
Declines flu shot today, encouraged ongoing heart healthy diet and an increase in regular exercise. Reviewed annual labs with patient.

## 2013-05-07 NOTE — Assessment & Plan Note (Signed)
No recent flares 

## 2013-05-12 ENCOUNTER — Telehealth: Payer: Self-pay

## 2013-05-12 NOTE — Telephone Encounter (Signed)
Pt called requesting a copy of his last labs. Copy put in the mail

## 2013-09-11 ENCOUNTER — Telehealth: Payer: Self-pay | Admitting: Family Medicine

## 2013-09-11 NOTE — Telephone Encounter (Signed)
Patients spouse called stating that patients labs should be covered at 100% and they keep getting bills for his labwork. Patients spouse states she has been Paediatric nurse and they just got back with her and told her that she needs to call her drs office and have md change the coding to physical?  Please advise?

## 2013-09-11 NOTE — Telephone Encounter (Signed)
Requesting to speak to nurse about lab from Oct, coding was entered wrong

## 2013-09-11 NOTE — Telephone Encounter (Signed)
I have forwarded to administrator, will be able to fix, sorry

## 2013-09-11 NOTE — Telephone Encounter (Signed)
Can you please have billing look in to what I can do to fix these bills for this patient. I assume I associated the wrong thing inadvertently. Can we use preventative to have this all covered? Can I use hyperlipidemia for the cholesterol and preventative for everything else? Please help

## 2013-12-13 ENCOUNTER — Ambulatory Visit (INDEPENDENT_AMBULATORY_CARE_PROVIDER_SITE_OTHER): Payer: BC Managed Care – PPO | Admitting: *Deleted

## 2013-12-13 ENCOUNTER — Ambulatory Visit: Payer: BC Managed Care – PPO

## 2013-12-13 DIAGNOSIS — Z111 Encounter for screening for respiratory tuberculosis: Secondary | ICD-10-CM

## 2013-12-13 NOTE — Progress Notes (Signed)
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 Left forms to be completed for himself, spouse and child. Pt will return on Friday for PPD reading. Forms forwarded to Provider for completion.

## 2013-12-21 ENCOUNTER — Telehealth: Payer: Self-pay | Admitting: Family Medicine

## 2013-12-21 NOTE — Telephone Encounter (Signed)
It is in the fax pile

## 2013-12-21 NOTE — Telephone Encounter (Signed)
Paperwork is ready. Please inform

## 2013-12-21 NOTE — Telephone Encounter (Signed)
Patient wife called in wanting to know if the paperwork that she had dropped off is completed? She states this is urgent.

## 2013-12-21 NOTE — Telephone Encounter (Signed)
Did you sign this?

## 2013-12-21 NOTE — Telephone Encounter (Signed)
Informed patient wife of this. °

## 2014-04-20 ENCOUNTER — Telehealth: Payer: Self-pay

## 2014-04-20 DIAGNOSIS — J029 Acute pharyngitis, unspecified: Secondary | ICD-10-CM

## 2014-04-20 NOTE — Telephone Encounter (Signed)
Per md order strep culture testing

## 2014-04-24 ENCOUNTER — Telehealth: Payer: Self-pay | Admitting: Family Medicine

## 2014-04-24 NOTE — Telephone Encounter (Signed)
Order was placed on 04-20-14

## 2014-04-24 NOTE — Telephone Encounter (Signed)
Caller name: Sahid  Relation to pt: self  Call back number: [phone]   Reason for call: pt requesting orders for throat culture. Pt scheduled lab appointment for 05/02/14. (pt stated Dr. Charlett Blake is aware)

## 2014-05-01 ENCOUNTER — Encounter: Payer: Self-pay | Admitting: Family Medicine

## 2014-05-02 ENCOUNTER — Other Ambulatory Visit (INDEPENDENT_AMBULATORY_CARE_PROVIDER_SITE_OTHER): Payer: BC Managed Care – PPO

## 2014-05-02 ENCOUNTER — Other Ambulatory Visit: Payer: BC Managed Care – PPO

## 2014-05-02 DIAGNOSIS — Z20818 Contact with and (suspected) exposure to other bacterial communicable diseases: Secondary | ICD-10-CM

## 2014-05-02 NOTE — Addendum Note (Signed)
Addended by: Bunnie Domino on: 05/02/2014 09:18 AM   Modules accepted: Orders

## 2014-05-04 LAB — CULTURE, GROUP A STREP: ORGANISM ID, BACTERIA: NORMAL

## 2014-05-07 ENCOUNTER — Ambulatory Visit (INDEPENDENT_AMBULATORY_CARE_PROVIDER_SITE_OTHER): Payer: BC Managed Care – PPO | Admitting: Family Medicine

## 2014-05-07 ENCOUNTER — Encounter: Payer: Self-pay | Admitting: Family Medicine

## 2014-05-07 VITALS — BP 125/75 | HR 61 | Temp 98.1°F | Ht 68.5 in | Wt 170.4 lb

## 2014-05-07 DIAGNOSIS — E785 Hyperlipidemia, unspecified: Secondary | ICD-10-CM

## 2014-05-07 DIAGNOSIS — G44009 Cluster headache syndrome, unspecified, not intractable: Secondary | ICD-10-CM

## 2014-05-07 DIAGNOSIS — Z0000 Encounter for general adult medical examination without abnormal findings: Secondary | ICD-10-CM

## 2014-05-07 DIAGNOSIS — Z23 Encounter for immunization: Secondary | ICD-10-CM

## 2014-05-07 LAB — LIPID PANEL
CHOL/HDL RATIO: 5
CHOLESTEROL: 236 mg/dL — AB (ref 0–200)
HDL: 48.3 mg/dL (ref >39.00)
LDL CALC: 171 mg/dL — AB (ref 0–99)
NONHDL: 187.7
TRIGLYCERIDES: 84 mg/dL (ref 0.0–149.0)
VLDL: 16.8 mg/dL (ref 0.0–40.0)

## 2014-05-07 LAB — RENAL FUNCTION PANEL
Albumin: 3.7 g/dL (ref 3.5–5.2)
BUN: 14 mg/dL (ref 6–23)
CHLORIDE: 106 meq/L (ref 96–112)
CO2: 26 meq/L (ref 19–32)
CREATININE: 1 mg/dL (ref 0.4–1.5)
Calcium: 9 mg/dL (ref 8.4–10.5)
GFR: 86.18 mL/min (ref >60.00)
Glucose, Bld: 86 mg/dL (ref 70–99)
POTASSIUM: 4.3 meq/L (ref 3.5–5.1)
Phosphorus: 3 mg/dL (ref 2.3–4.6)
SODIUM: 141 meq/L (ref 135–145)

## 2014-05-07 LAB — HEPATIC FUNCTION PANEL
ALBUMIN: 3.7 g/dL (ref 3.5–5.2)
ALT: 21 U/L (ref 0–53)
AST: 26 U/L (ref 0–37)
Alkaline Phosphatase: 43 U/L (ref 39–117)
Bilirubin, Direct: 0 mg/dL (ref 0.0–0.3)
TOTAL PROTEIN: 7.4 g/dL (ref 6.0–8.3)
Total Bilirubin: 0.6 mg/dL (ref 0.2–1.2)

## 2014-05-07 LAB — VITAMIN D 25 HYDROXY (VIT D DEFICIENCY, FRACTURES): VITD: 33.91 ng/mL (ref 30.00–100.00)

## 2014-05-07 LAB — TSH: TSH: 1.03 u[IU]/mL (ref 0.35–4.50)

## 2014-05-07 NOTE — Patient Instructions (Signed)
Preventive Care for Adults A healthy lifestyle and preventive care can promote health and wellness. Preventive health guidelines for men include the following key practices:  A routine yearly physical is a good way to check with your health care provider about your health and preventative screening. It is a chance to share any concerns and updates on your health and to receive a thorough exam.  Visit your dentist for a routine exam and preventative care every 6 months. Brush your teeth twice a day and floss once a day. Good oral hygiene prevents tooth decay and gum disease.  The frequency of eye exams is based on your age, health, family medical history, use of contact lenses, and other factors. Follow your health care provider's recommendations for frequency of eye exams.  Eat a healthy diet. Foods such as vegetables, fruits, whole grains, low-fat dairy products, and lean protein foods contain the nutrients you need without too many calories. Decrease your intake of foods high in solid fats, added sugars, and salt. Eat the right amount of calories for you.Get information about a proper diet from your health care provider, if necessary.  Regular physical exercise is one of the most important things you can do for your health. Most adults should get at least 150 minutes of moderate-intensity exercise (any activity that increases your heart rate and causes you to sweat) each week. In addition, most adults need muscle-strengthening exercises on 2 or more days a week.  Maintain a healthy weight. The body mass index (BMI) is a screening tool to identify possible weight problems. It provides an estimate of body fat based on height and weight. Your health care provider can find your BMI and can help you achieve or maintain a healthy weight.For adults 20 years and older:  A BMI below 18.5 is considered underweight.  A BMI of 18.5 to 24.9 is normal.  A BMI of 25 to 29.9 is considered overweight.  A BMI  of 30 and above is considered obese.  Maintain normal blood lipids and cholesterol levels by exercising and minimizing your intake of saturated fat. Eat a balanced diet with plenty of fruit and vegetables. Blood tests for lipids and cholesterol should begin at age 50 and be repeated every 5 years. If your lipid or cholesterol levels are high, you are over 50, or you are at high risk for heart disease, you may need your cholesterol levels checked more frequently.Ongoing high lipid and cholesterol levels should be treated with medicines if diet and exercise are not working.  If you smoke, find out from your health care provider how to quit. If you do not use tobacco, do not start.  Lung cancer screening is recommended for adults aged 73-80 years who are at high risk for developing lung cancer because of a history of smoking. A yearly low-dose CT scan of the lungs is recommended for people who have at least a 30-pack-year history of smoking and are a current smoker or have quit within the past 15 years. A pack year of smoking is smoking an average of 1 pack of cigarettes a day for 1 year (for example: 1 pack a day for 30 years or 2 packs a day for 15 years). Yearly screening should continue until the smoker has stopped smoking for at least 15 years. Yearly screening should be stopped for people who develop a health problem that would prevent them from having lung cancer treatment.  If you choose to drink alcohol, do not have more than  2 drinks per day. One drink is considered to be 12 ounces (355 mL) of beer, 5 ounces (148 mL) of wine, or 1.5 ounces (44 mL) of liquor.  Avoid use of street drugs. Do not share needles with anyone. Ask for help if you need support or instructions about stopping the use of drugs.  High blood pressure causes heart disease and increases the risk of stroke. Your blood pressure should be checked at least every 1-2 years. Ongoing high blood pressure should be treated with  medicines, if weight loss and exercise are not effective.  If you are 45-79 years old, ask your health care provider if you should take aspirin to prevent heart disease.  Diabetes screening involves taking a blood sample to check your fasting blood sugar level. This should be done once every 3 years, after age 45, if you are within normal weight and without risk factors for diabetes. Testing should be considered at a younger age or be carried out more frequently if you are overweight and have at least 1 risk factor for diabetes.  Colorectal cancer can be detected and often prevented. Most routine colorectal cancer screening begins at the age of 50 and continues through age 75. However, your health care provider may recommend screening at an earlier age if you have risk factors for colon cancer. On a yearly basis, your health care provider may provide home test kits to check for hidden blood in the stool. Use of a small camera at the end of a tube to directly examine the colon (sigmoidoscopy or colonoscopy) can detect the earliest forms of colorectal cancer. Talk to your health care provider about this at age 50, when routine screening begins. Direct exam of the colon should be repeated every 5-10 years through age 75, unless early forms of precancerous polyps or small growths are found.  People who are at an increased risk for hepatitis B should be screened for this virus. You are considered at high risk for hepatitis B if:  You were born in a country where hepatitis B occurs often. Talk with your health care provider about which countries are considered high risk.  Your parents were born in a high-risk country and you have not received a shot to protect against hepatitis B (hepatitis B vaccine).  You have HIV or AIDS.  You use needles to inject street drugs.  You live with, or have sex with, someone who has hepatitis B.  You are a man who has sex with other men (MSM).  You get hemodialysis  treatment.  You take certain medicines for conditions such as cancer, organ transplantation, and autoimmune conditions.  Hepatitis C blood testing is recommended for all people born from 1945 through 1965 and any individual with known risks for hepatitis C.  Practice safe sex. Use condoms and avoid high-risk sexual practices to reduce the spread of sexually transmitted infections (STIs). STIs include gonorrhea, chlamydia, syphilis, trichomonas, herpes, HPV, and human immunodeficiency virus (HIV). Herpes, HIV, and HPV are viral illnesses that have no cure. They can result in disability, cancer, and death.  If you are at risk of being infected with HIV, it is recommended that you take a prescription medicine daily to prevent HIV infection. This is called preexposure prophylaxis (PrEP). You are considered at risk if:  You are a man who has sex with other men (MSM) and have other risk factors.  You are a heterosexual man, are sexually active, and are at increased risk for HIV infection.    You take drugs by injection.  You are sexually active with a partner who has HIV.  Talk with your health care provider about whether you are at high risk of being infected with HIV. If you choose to begin PrEP, you should first be tested for HIV. You should then be tested every 3 months for as long as you are taking PrEP.  A one-time screening for abdominal aortic aneurysm (AAA) and surgical repair of large AAAs by ultrasound are recommended for men ages 32 to 67 years who are current or former smokers.  Healthy men should no longer receive prostate-specific antigen (PSA) blood tests as part of routine cancer screening. Talk with your health care provider about prostate cancer screening.  Testicular cancer screening is not recommended for adult males who have no symptoms. Screening includes self-exam, a health care provider exam, and other screening tests. Consult with your health care provider about any symptoms  you have or any concerns you have about testicular cancer.  Use sunscreen. Apply sunscreen liberally and repeatedly throughout the day. You should seek shade when your shadow is shorter than you. Protect yourself by wearing long sleeves, pants, a wide-brimmed hat, and sunglasses year round, whenever you are outdoors.  Once a month, do a whole-body skin exam, using a mirror to look at the skin on your back. Tell your health care provider about new moles, moles that have irregular borders, moles that are larger than a pencil eraser, or moles that have changed in shape or color.  Stay current with required vaccines (immunizations).  Influenza vaccine. All adults should be immunized every year.  Tetanus, diphtheria, and acellular pertussis (Td, Tdap) vaccine. An adult who has not previously received Tdap or who does not know his vaccine status should receive 1 dose of Tdap. This initial dose should be followed by tetanus and diphtheria toxoids (Td) booster doses every 10 years. Adults with an unknown or incomplete history of completing a 3-dose immunization series with Td-containing vaccines should begin or complete a primary immunization series including a Tdap dose. Adults should receive a Td booster every 10 years.  Varicella vaccine. An adult without evidence of immunity to varicella should receive 2 doses or a second dose if he has previously received 1 dose.  Human papillomavirus (HPV) vaccine. Males aged 68-21 years who have not received the vaccine previously should receive the 3-dose series. Males aged 22-26 years may be immunized. Immunization is recommended through the age of 6 years for any male who has sex with males and did not get any or all doses earlier. Immunization is recommended for any person with an immunocompromised condition through the age of 49 years if he did not get any or all doses earlier. During the 3-dose series, the second dose should be obtained 4-8 weeks after the first  dose. The third dose should be obtained 24 weeks after the first dose and 16 weeks after the second dose.  Zoster vaccine. One dose is recommended for adults aged 50 years or older unless certain conditions are present.  Measles, mumps, and rubella (MMR) vaccine. Adults born before [birth year] generally are considered immune to measles and mumps. Adults born in [birth year] or later should have 1 or more doses of MMR vaccine unless there is a contraindication to the vaccine or there is laboratory evidence of immunity to each of the three diseases. A routine second dose of MMR vaccine should be obtained at least 28 days after the first dose for students attending postsecondary  schools, health care workers, or international travelers. People who received inactivated measles vaccine or an unknown type of measles vaccine during 1963-1967 should receive 2 doses of MMR vaccine. People who received inactivated mumps vaccine or an unknown type of mumps vaccine before 1979 and are at high risk for mumps infection should consider immunization with 2 doses of MMR vaccine. Unvaccinated health care workers born before 1957 who lack laboratory evidence of measles, mumps, or rubella immunity or laboratory confirmation of disease should consider measles and mumps immunization with 2 doses of MMR vaccine or rubella immunization with 1 dose of MMR vaccine.  Pneumococcal 13-valent conjugate (PCV13) vaccine. When indicated, a person who is uncertain of his immunization history and has no record of immunization should receive the PCV13 vaccine. An adult aged 19 years or older who has certain medical conditions and has not been previously immunized should receive 1 dose of PCV13 vaccine. This PCV13 should be followed with a dose of pneumococcal polysaccharide (PPSV23) vaccine. The PPSV23 vaccine dose should be obtained at least 8 weeks after the dose of PCV13 vaccine. An adult aged 19 years or older who has certain medical conditions and  previously received 1 or more doses of PPSV23 vaccine should receive 1 dose of PCV13. The PCV13 vaccine dose should be obtained 1 or more years after the last PPSV23 vaccine dose.  Pneumococcal polysaccharide (PPSV23) vaccine. When PCV13 is also indicated, PCV13 should be obtained first. All adults aged 65 years and older should be immunized. An adult younger than age 65 years who has certain medical conditions should be immunized. Any person who resides in a nursing home or long-term care facility should be immunized. An adult smoker should be immunized. People with an immunocompromised condition and certain other conditions should receive both PCV13 and PPSV23 vaccines. People with human immunodeficiency virus (HIV) infection should be immunized as soon as possible after diagnosis. Immunization during chemotherapy or radiation therapy should be avoided. Routine use of PPSV23 vaccine is not recommended for American Indians, [place] Natives, or people younger than 65 years unless there are medical conditions that require PPSV23 vaccine. When indicated, people who have unknown immunization and have no record of immunization should receive PPSV23 vaccine. One-time revaccination 5 years after the first dose of PPSV23 is recommended for people aged 19-64 years who have chronic kidney failure, nephrotic syndrome, asplenia, or immunocompromised conditions. People who received 1-2 doses of PPSV23 before age 65 years should receive another dose of PPSV23 vaccine at age 65 years or later if at least 5 years have passed since the previous dose. Doses of PPSV23 are not needed for people immunized with PPSV23 at or after age 65 years.  Meningococcal vaccine. Adults with asplenia or persistent complement component deficiencies should receive 2 doses of quadrivalent meningococcal conjugate (MenACWY-D) vaccine. The doses should be obtained at least 2 months apart. Microbiologists working with certain meningococcal bacteria,  military recruits, people at risk during an outbreak, and people who travel to or live in countries with a high rate of meningitis should be immunized. A first-year college student up through age 21 years who is living in a residence hall should receive a dose if he did not receive a dose on or after his 16th birthday. Adults who have certain high-risk conditions should receive one or more doses of vaccine.  Hepatitis A vaccine. Adults who wish to be protected from this disease, have certain high-risk conditions, work with hepatitis A-infected animals, work in hepatitis A research labs, or   travel to or work in countries with a high rate of hepatitis A should be immunized. Adults who were previously unvaccinated and who anticipate close contact with an international adoptee during the first 60 days after arrival in the [place] from a country with a high rate of hepatitis A should be immunized.  Hepatitis B vaccine. Adults should be immunized if they wish to be protected from this disease, have certain high-risk conditions, may be exposed to blood or other infectious body fluids, are household contacts or sex partners of hepatitis B positive people, are clients or workers in certain care facilities, or travel to or work in countries with a high rate of hepatitis B.  Haemophilus influenzae type b (Hib) vaccine. A previously unvaccinated person with asplenia or sickle cell disease or having a scheduled splenectomy should receive 1 dose of Hib vaccine. Regardless of previous immunization, a recipient of a hematopoietic stem cell transplant should receive a 3-dose series 6-12 months after his successful transplant. Hib vaccine is not recommended for adults with HIV infection. Preventive Service / Frequency Ages 52 to 17  Blood pressure check.** / Every 1 to 2 years.  Lipid and cholesterol check.** / Every 5 years beginning at age 69.  Hepatitis C blood test.** / For any individual with known risks for  hepatitis C.  Skin self-exam. / Monthly.  Influenza vaccine. / Every year.  Tetanus, diphtheria, and acellular pertussis (Tdap, Td) vaccine.** / Consult your health care provider. 1 dose of Td every 10 years.  Varicella vaccine.** / Consult your health care provider.  HPV vaccine. / 3 doses over 6 months, if 72 or younger.  Measles, mumps, rubella (MMR) vaccine.** / You need at least 1 dose of MMR if you were born in 1957 or later. You may also need a second dose.  Pneumococcal 13-valent conjugate (PCV13) vaccine.** / Consult your health care provider.  Pneumococcal polysaccharide (PPSV23) vaccine.** / 1 to 2 doses if you smoke cigarettes or if you have certain conditions.  Meningococcal vaccine.** / 1 dose if you are age 35 to 60 years and a Market researcher living in a residence hall, or have one of several medical conditions. You may also need additional booster doses.  Hepatitis A vaccine.** / Consult your health care provider.  Hepatitis B vaccine.** / Consult your health care provider.  Haemophilus influenzae type b (Hib) vaccine.** / Consult your health care provider. Ages 35 to 8  Blood pressure check.** / Every 1 to 2 years.  Lipid and cholesterol check.** / Every 5 years beginning at age 57.  Lung cancer screening. / Every year if you are aged 44-80 years and have a 30-pack-year history of smoking and currently smoke or have quit within the past 15 years. Yearly screening is stopped once you have quit smoking for at least 15 years or develop a health problem that would prevent you from having lung cancer treatment.  Fecal occult blood test (FOBT) of stool. / Every year beginning at age 55 and continuing until age 73. You may not have to do this test if you get a colonoscopy every 10 years.  Flexible sigmoidoscopy** or colonoscopy.** / Every 5 years for a flexible sigmoidoscopy or every 10 years for a colonoscopy beginning at age 28 and continuing until age  1.  Hepatitis C blood test.** / For all people born from [birth year] through 1965 and any individual with known risks for hepatitis C.  Skin self-exam. / Monthly.  Influenza vaccine. / Every  year.  Tetanus, diphtheria, and acellular pertussis (Tdap/Td) vaccine.** / Consult your health care provider. 1 dose of Td every 10 years.  Varicella vaccine.** / Consult your health care provider.  Zoster vaccine.** / 1 dose for adults aged 53 years or older.  Measles, mumps, rubella (MMR) vaccine.** / You need at least 1 dose of MMR if you were born in 1957 or later. You may also need a second dose.  Pneumococcal 13-valent conjugate (PCV13) vaccine.** / Consult your health care provider.  Pneumococcal polysaccharide (PPSV23) vaccine.** / 1 to 2 doses if you smoke cigarettes or if you have certain conditions.  Meningococcal vaccine.** / Consult your health care provider.  Hepatitis A vaccine.** / Consult your health care provider.  Hepatitis B vaccine.** / Consult your health care provider.  Haemophilus influenzae type b (Hib) vaccine.** / Consult your health care provider. Ages 77 and over  Blood pressure check.** / Every 1 to 2 years.  Lipid and cholesterol check.**/ Every 5 years beginning at age 85.  Lung cancer screening. / Every year if you are aged 55-80 years and have a 30-pack-year history of smoking and currently smoke or have quit within the past 15 years. Yearly screening is stopped once you have quit smoking for at least 15 years or develop a health problem that would prevent you from having lung cancer treatment.  Fecal occult blood test (FOBT) of stool. / Every year beginning at age 33 and continuing until age 11. You may not have to do this test if you get a colonoscopy every 10 years.  Flexible sigmoidoscopy** or colonoscopy.** / Every 5 years for a flexible sigmoidoscopy or every 10 years for a colonoscopy beginning at age 28 and continuing until age 73.  Hepatitis C blood  test.** / For all people born from [birth year] through 1965 and any individual with known risks for hepatitis C.  Abdominal aortic aneurysm (AAA) screening.** / A one-time screening for ages 50 to 27 years who are current or former smokers.  Skin self-exam. / Monthly.  Influenza vaccine. / Every year.  Tetanus, diphtheria, and acellular pertussis (Tdap/Td) vaccine.** / 1 dose of Td every 10 years.  Varicella vaccine.** / Consult your health care provider.  Zoster vaccine.** / 1 dose for adults aged 34 years or older.  Pneumococcal 13-valent conjugate (PCV13) vaccine.** / Consult your health care provider.  Pneumococcal polysaccharide (PPSV23) vaccine.** / 1 dose for all adults aged 63 years and older.  Meningococcal vaccine.** / Consult your health care provider.  Hepatitis A vaccine.** / Consult your health care provider.  Hepatitis B vaccine.** / Consult your health care provider.  Haemophilus influenzae type b (Hib) vaccine.** / Consult your health care provider. **Family history and personal history of risk and conditions may change your health care provider's recommendations. Document Released: 09/01/2001 Document Revised: 07/11/2013 Document Reviewed: 12/01/2010 [facility] Patient Information 2015 [place], [place]. This information is not intended to replace advice given to you by your health care provider. Make sure you discuss any questions you have with your health care provider.

## 2014-05-07 NOTE — Progress Notes (Signed)
Pre visit review using our clinic review tool, if applicable. No additional management support is needed unless otherwise documented below in the visit note. 

## 2014-05-07 NOTE — Progress Notes (Signed)
Patient ID: Marc Jenkins, male   DOB: 1965-08-29, 48 y.o.   MRN: [id number] Willam Munford [id number] [birth year] 05/07/2014      Progress Note-Follow Up  Subjective  Chief Complaint  Chief Complaint  Patient presents with  . Annual Exam    physical  . Injections    tdap    HPI  Patient is a 48 year old male in today for routine medical care. Doing well continues to travel frequently for work. Eats a heart healthy diet most of the time and exercises regularly. Denies any recent illness. Is requesting DNA testing due to some concerns they are having with their daughter. Denies CP/palp/SOB/HA/congestion/fevers/GI or GU c/o. Taking meds as prescribed  Past Medical History  Diagnosis Date  . Chicken pox as a child  . Measles as a child  . Mumps as a child  . Depression   . Allergy   . Headache, cluster   . Hyperlipidemia 05/05/2011  . Preventative health care 05/05/2011  . Left hand pain 05/04/2012  . FH: colon cancer 05/04/2012    Past Surgical History  Procedure Laterality Date  . Achilles tendon surgery  2-11    Family History  Problem Relation Age of Onset  . Ulcers Mother     in colon  . Hyperlipidemia Mother   . Cancer Father 68    colon  . Hyperlipidemia Sister   . Diabetes Sister     type 2/type 2  . Obesity Sister     History   Social History  . Marital Status: Married    Spouse Name: N/A    Number of Children: N/A  . Years of Education: N/A   Occupational History  . Not on file.   Social History Main Topics  . Smoking status: Never Smoker   . Smokeless tobacco: Never Used  . Alcohol Use: Not on file     Comment: 2 glass of wine or a beer nightly  . Drug Use: No  . Sexual Activity: Yes    Partners: Female   Other Topics Concern  . Not on file   Social History Narrative  . No narrative on file    Current Outpatient Prescriptions on File Prior to Visit  Medication Sig Dispense Refill  . COD LIVER OIL PO Take by mouth.      .  Multiple Vitamin (MULTIVITAMIN) capsule Take 1 capsule by mouth daily.       No current facility-administered medications on file prior to visit.    No Known Allergies  Review of Systems  Review of Systems  Constitutional: Negative for fever, chills and malaise/fatigue.  HENT: Negative for congestion, hearing loss and nosebleeds.   Eyes: Negative for discharge.  Respiratory: Negative for cough, sputum production, shortness of breath and wheezing.   Cardiovascular: Negative for chest pain, palpitations and leg swelling.  Gastrointestinal: Negative for heartburn, nausea, vomiting, abdominal pain, diarrhea, constipation and blood in stool.  Genitourinary: Negative for dysuria, urgency, frequency and hematuria.  Musculoskeletal: Negative for back pain, falls and myalgias.  Skin: Negative for rash.  Neurological: Negative for dizziness, tremors, sensory change, focal weakness, loss of consciousness, weakness and headaches.  Endo/Heme/Allergies: Negative for polydipsia. Does not bruise/bleed easily.  Psychiatric/Behavioral: Negative for depression and suicidal ideas. The patient is not nervous/anxious and does not have insomnia.     Objective  BP 125/75  Pulse 61  Temp(Src) 98.1 F (36.7 C) (Oral)  Ht 5' 8.5" (1.74 m)  Wt 170 lb 6.4 oz (  77.293 kg)  BMI 25.53 kg/m2  SpO2 100%  Physical Exam  Physical Exam  Constitutional: He is oriented to person, place, and time and well-developed, well-nourished, and in no distress. No distress.  HENT:  Head: Normocephalic and atraumatic.  Eyes: Conjunctivae are normal.  Neck: Neck supple. No thyromegaly present.  Cardiovascular: Normal rate, regular rhythm and normal heart sounds.   No murmur heard. Pulmonary/Chest: Effort normal and breath sounds normal. No respiratory distress.  Abdominal: He exhibits no distension and no mass. There is no tenderness.  Musculoskeletal: He exhibits no edema.  Neurological: He is alert and oriented to  person, place, and time.  Skin: Skin is warm. No pallor.  Psychiatric: Memory, affect and judgment normal.    Lab Results  Component Value Date   TSH 2.125 05/03/2013   Lab Results  Component Value Date   WBC 4.4 05/03/2013   HGB 13.7 05/03/2013   HCT 39.9 05/03/2013   MCV 92.1 05/03/2013   PLT 252 05/03/2013   Lab Results  Component Value Date   CREATININE 1.00 05/03/2013   BUN 12 05/03/2013   NA 139 05/03/2013   K 4.6 05/03/2013   CL 106 05/03/2013   CO2 28 05/03/2013   Lab Results  Component Value Date   ALT 19 05/03/2013   AST 19 05/03/2013   ALKPHOS 39 05/03/2013   BILITOT 0.6 05/03/2013   Lab Results  Component Value Date   CHOL 243* 05/03/2013   Lab Results  Component Value Date   HDL 60 05/03/2013   Lab Results  Component Value Date   LDLCALC 162* 05/03/2013   Lab Results  Component Value Date   TRIG 104 05/03/2013   Lab Results  Component Value Date   CHOLHDL 4.1 05/03/2013     Assessment & Plan   Preventative health care Patient encouraged to maintain heart healthy diet, regular exercise, adequate sleep. Consider daily probiotics. Take medications as prescribed. Follows with Dr Renda Rolls of dermatology. Requests DNA testing MTHFR performed today. Relates to his daughters health. Declines Tdap and flu shot.   Hyperlipidemia Encouraged heart healthy diet, increase exercise, avoid trans fats, consider a krill oil cap daily

## 2014-05-13 NOTE — Assessment & Plan Note (Signed)
Patient encouraged to maintain heart healthy diet, regular exercise, adequate sleep. Consider daily probiotics. Take medications as prescribed. Follows with Dr Renda Rolls of dermatology. Requests DNA testing MTHFR performed today. Relates to his daughters health. Declines Tdap and flu shot.

## 2014-05-13 NOTE — Assessment & Plan Note (Signed)
Encouraged heart healthy diet, increase exercise, avoid trans fats, consider a krill oil cap daily 

## 2014-07-17 ENCOUNTER — Encounter: Payer: Self-pay | Admitting: Family Medicine

## 2014-11-05 ENCOUNTER — Encounter: Payer: Self-pay | Admitting: Family Medicine

## 2014-11-06 ENCOUNTER — Other Ambulatory Visit: Payer: Self-pay | Admitting: Family Medicine

## 2014-11-06 DIAGNOSIS — E782 Mixed hyperlipidemia: Secondary | ICD-10-CM

## 2014-11-06 NOTE — Telephone Encounter (Signed)
Lab entered for appt.

## 2014-11-12 ENCOUNTER — Ambulatory Visit (INDEPENDENT_AMBULATORY_CARE_PROVIDER_SITE_OTHER): Payer: BLUE CROSS/BLUE SHIELD | Admitting: Family Medicine

## 2014-11-12 ENCOUNTER — Encounter: Payer: Self-pay | Admitting: Family Medicine

## 2014-11-12 VITALS — BP 130/82 | HR 61 | Temp 98.0°F | Resp 16 | Ht 68.0 in | Wt 170.0 lb

## 2014-11-12 DIAGNOSIS — Z1211 Encounter for screening for malignant neoplasm of colon: Secondary | ICD-10-CM

## 2014-11-12 DIAGNOSIS — G44009 Cluster headache syndrome, unspecified, not intractable: Secondary | ICD-10-CM

## 2014-11-12 DIAGNOSIS — E785 Hyperlipidemia, unspecified: Secondary | ICD-10-CM

## 2014-11-12 DIAGNOSIS — Z0000 Encounter for general adult medical examination without abnormal findings: Secondary | ICD-10-CM

## 2014-11-12 DIAGNOSIS — R252 Cramp and spasm: Secondary | ICD-10-CM | POA: Diagnosis not present

## 2014-11-12 DIAGNOSIS — J309 Allergic rhinitis, unspecified: Secondary | ICD-10-CM | POA: Diagnosis not present

## 2014-11-12 HISTORY — DX: Cramp and spasm: R25.2

## 2014-11-12 LAB — COMPREHENSIVE METABOLIC PANEL
ALK PHOS: 46 U/L (ref 39–117)
ALT: 20 U/L (ref 0–53)
AST: 25 U/L (ref 0–37)
Albumin: 4.4 g/dL (ref 3.5–5.2)
BUN: 19 mg/dL (ref 6–23)
CO2: 28 mEq/L (ref 19–32)
CREATININE: 0.92 mg/dL (ref 0.40–1.50)
Calcium: 9.5 mg/dL (ref 8.4–10.5)
Chloride: 105 mEq/L (ref 96–112)
GFR: 92.5 mL/min (ref >60.00)
Glucose, Bld: 77 mg/dL (ref 70–99)
Potassium: 4.6 mEq/L (ref 3.5–5.1)
SODIUM: 138 meq/L (ref 135–145)
TOTAL PROTEIN: 7 g/dL (ref 6.0–8.3)
Total Bilirubin: 0.5 mg/dL (ref 0.2–1.2)

## 2014-11-12 LAB — LIPID PANEL
CHOL/HDL RATIO: 4
Cholesterol: 239 mg/dL — ABNORMAL HIGH (ref 0–200)
HDL: 54.2 mg/dL (ref >39.00)
LDL Cholesterol: 163 mg/dL — ABNORMAL HIGH (ref 0–99)
NONHDL: 184.8
TRIGLYCERIDES: 107 mg/dL (ref 0.0–149.0)
VLDL: 21.4 mg/dL (ref 0.0–40.0)

## 2014-11-12 LAB — CBC
HCT: 39.6 % (ref 39.0–52.0)
HEMOGLOBIN: 13.4 g/dL (ref 13.0–17.0)
MCHC: 33.8 g/dL (ref 30.0–36.0)
MCV: 91.3 fl (ref 78.0–100.0)
PLATELETS: 228 10*3/uL (ref 150.0–400.0)
RBC: 4.34 Mil/uL (ref 4.22–5.81)
RDW: 13.7 % (ref 11.5–15.5)
WBC: 3.9 10*3/uL — AB (ref 4.0–10.5)

## 2014-11-12 LAB — TSH: TSH: 0.84 u[IU]/mL (ref 0.35–4.50)

## 2014-11-12 LAB — PSA: PSA: 1.36 ng/mL (ref 0.10–4.00)

## 2014-11-12 LAB — MAGNESIUM: Magnesium: 2 mg/dL (ref 1.5–2.5)

## 2014-11-12 NOTE — Patient Instructions (Signed)
Consider giving [place] a copy of healthcare power of attorney and living will for your chart   Preventive Care for Adults A healthy lifestyle and preventive care can promote health and wellness. Preventive health guidelines for men include the following key practices:  A routine yearly physical is a good way to check with your health care provider about your health and preventative screening. It is a chance to share any concerns and updates on your health and to receive a thorough exam.  Visit your dentist for a routine exam and preventative care every 6 months. Brush your teeth twice a day and floss once a day. Good oral hygiene prevents tooth decay and gum disease.  The frequency of eye exams is based on your age, health, family medical history, use of contact lenses, and other factors. Follow your health care provider's recommendations for frequency of eye exams.  Eat a healthy diet. Foods such as vegetables, fruits, whole grains, low-fat dairy products, and lean protein foods contain the nutrients you need without too many calories. Decrease your intake of foods high in solid fats, added sugars, and salt. Eat the right amount of calories for you.Get information about a proper diet from your health care provider, if necessary.  Regular physical exercise is one of the most important things you can do for your health. Most adults should get at least 150 minutes of moderate-intensity exercise (any activity that increases your heart rate and causes you to sweat) each week. In addition, most adults need muscle-strengthening exercises on 2 or more days a week.  Maintain a healthy weight. The body mass index (BMI) is a screening tool to identify possible weight problems. It provides an estimate of body fat based on height and weight. Your health care provider can find your BMI and can help you achieve or maintain a healthy weight.For adults 20 years and older:  A BMI below 18.5 is considered  underweight.  A BMI of 18.5 to 24.9 is normal.  A BMI of 25 to 29.9 is considered overweight.  A BMI of 30 and above is considered obese.  Maintain normal blood lipids and cholesterol levels by exercising and minimizing your intake of saturated fat. Eat a balanced diet with plenty of fruit and vegetables. Blood tests for lipids and cholesterol should begin at age 3 and be repeated every 5 years. If your lipid or cholesterol levels are high, you are over 50, or you are at high risk for heart disease, you may need your cholesterol levels checked more frequently.Ongoing high lipid and cholesterol levels should be treated with medicines if diet and exercise are not working.  If you smoke, find out from your health care provider how to quit. If you do not use tobacco, do not start.  Lung cancer screening is recommended for adults aged 28-80 years who are at high risk for developing lung cancer because of a history of smoking. A yearly low-dose CT scan of the lungs is recommended for people who have at least a 30-pack-year history of smoking and are a current smoker or have quit within the past 15 years. A pack year of smoking is smoking an average of 1 pack of cigarettes a day for 1 year (for example: 1 pack a day for 30 years or 2 packs a day for 15 years). Yearly screening should continue until the smoker has stopped smoking for at least 15 years. Yearly screening should be stopped for people who develop a health problem that would prevent  them from having lung cancer treatment.  If you choose to drink alcohol, do not have more than 2 drinks per day. One drink is considered to be 12 ounces (355 mL) of beer, 5 ounces (148 mL) of wine, or 1.5 ounces (44 mL) of liquor.  Avoid use of street drugs. Do not share needles with anyone. Ask for help if you need support or instructions about stopping the use of drugs.  High blood pressure causes heart disease and increases the risk of stroke. Your blood  pressure should be checked at least every 1-2 years. Ongoing high blood pressure should be treated with medicines, if weight loss and exercise are not effective.  If you are 47-56 years old, ask your health care provider if you should take aspirin to prevent heart disease.  Diabetes screening involves taking a blood sample to check your fasting blood sugar level. This should be done once every 3 years, after age 75, if you are within normal weight and without risk factors for diabetes. Testing should be considered at a younger age or be carried out more frequently if you are overweight and have at least 1 risk factor for diabetes.  Colorectal cancer can be detected and often prevented. Most routine colorectal cancer screening begins at the age of 49 and continues through age 62. However, your health care provider may recommend screening at an earlier age if you have risk factors for colon cancer. On a yearly basis, your health care provider may provide home test kits to check for hidden blood in the stool. Use of a small camera at the end of a tube to directly examine the colon (sigmoidoscopy or colonoscopy) can detect the earliest forms of colorectal cancer. Talk to your health care provider about this at age 66, when routine screening begins. Direct exam of the colon should be repeated every 5-10 years through age 25, unless early forms of precancerous polyps or small growths are found.  People who are at an increased risk for hepatitis B should be screened for this virus. You are considered at high risk for hepatitis B if:  You were born in a country where hepatitis B occurs often. Talk with your health care provider about which countries are considered high risk.  Your parents were born in a high-risk country and you have not received a shot to protect against hepatitis B (hepatitis B vaccine).  You have HIV or AIDS.  You use needles to inject street drugs.  You live with, or have sex with,  someone who has hepatitis B.  You are a man who has sex with other men (MSM).  You get hemodialysis treatment.  You take certain medicines for conditions such as cancer, organ transplantation, and autoimmune conditions.  Hepatitis C blood testing is recommended for all people born from [birth year] through 1965 and any individual with known risks for hepatitis C.  Practice safe sex. Use condoms and avoid high-risk sexual practices to reduce the spread of sexually transmitted infections (STIs). STIs include gonorrhea, chlamydia, syphilis, trichomonas, herpes, HPV, and human immunodeficiency virus (HIV). Herpes, HIV, and HPV are viral illnesses that have no cure. They can result in disability, cancer, and death.  If you are at risk of being infected with HIV, it is recommended that you take a prescription medicine daily to prevent HIV infection. This is called preexposure prophylaxis (PrEP). You are considered at risk if:  You are a man who has sex with other men (MSM) and have other risk  factors.  You are a heterosexual man, are sexually active, and are at increased risk for HIV infection.  You take drugs by injection.  You are sexually active with a partner who has HIV.  Talk with your health care provider about whether you are at high risk of being infected with HIV. If you choose to begin PrEP, you should first be tested for HIV. You should then be tested every 3 months for as long as you are taking PrEP.  A one-time screening for abdominal aortic aneurysm (AAA) and surgical repair of large AAAs by ultrasound are recommended for men ages 101 to 77 years who are current or former smokers.  Healthy men should no longer receive prostate-specific antigen (PSA) blood tests as part of routine cancer screening. Talk with your health care provider about prostate cancer screening.  Testicular cancer screening is not recommended for adult males who have no symptoms. Screening includes self-exam, a health  care provider exam, and other screening tests. Consult with your health care provider about any symptoms you have or any concerns you have about testicular cancer.  Use sunscreen. Apply sunscreen liberally and repeatedly throughout the day. You should seek shade when your shadow is shorter than you. Protect yourself by wearing long sleeves, pants, a wide-brimmed hat, and sunglasses year round, whenever you are outdoors.  Once a month, do a whole-body skin exam, using a mirror to look at the skin on your back. Tell your health care provider about new moles, moles that have irregular borders, moles that are larger than a pencil eraser, or moles that have changed in shape or color.  Stay current with required vaccines (immunizations).  Influenza vaccine. All adults should be immunized every year.  Tetanus, diphtheria, and acellular pertussis (Td, Tdap) vaccine. An adult who has not previously received Tdap or who does not know his vaccine status should receive 1 dose of Tdap. This initial dose should be followed by tetanus and diphtheria toxoids (Td) booster doses every 10 years. Adults with an unknown or incomplete history of completing a 3-dose immunization series with Td-containing vaccines should begin or complete a primary immunization series including a Tdap dose. Adults should receive a Td booster every 10 years.  Varicella vaccine. An adult without evidence of immunity to varicella should receive 2 doses or a second dose if he has previously received 1 dose.  Human papillomavirus (HPV) vaccine. Males aged 79-21 years who have not received the vaccine previously should receive the 3-dose series. Males aged 22-26 years may be immunized. Immunization is recommended through the age of 58 years for any male who has sex with males and did not get any or all doses earlier. Immunization is recommended for any person with an immunocompromised condition through the age of 40 years if he did not get any or  all doses earlier. During the 3-dose series, the second dose should be obtained 4-8 weeks after the first dose. The third dose should be obtained 24 weeks after the first dose and 16 weeks after the second dose.  Zoster vaccine. One dose is recommended for adults aged 67 years or older unless certain conditions are present.  Measles, mumps, and rubella (MMR) vaccine. Adults born before [birth year] generally are considered immune to measles and mumps. Adults born in [birth year] or later should have 1 or more doses of MMR vaccine unless there is a contraindication to the vaccine or there is laboratory evidence of immunity to each of the three diseases. A routine second  dose of MMR vaccine should be obtained at least 28 days after the first dose for students attending postsecondary schools, health care workers, or international travelers. People who received inactivated measles vaccine or an unknown type of measles vaccine during 1963-1967 should receive 2 doses of MMR vaccine. People who received inactivated mumps vaccine or an unknown type of mumps vaccine before 1979 and are at high risk for mumps infection should consider immunization with 2 doses of MMR vaccine. Unvaccinated health care workers born before [birth year] who lack laboratory evidence of measles, mumps, or rubella immunity or laboratory confirmation of disease should consider measles and mumps immunization with 2 doses of MMR vaccine or rubella immunization with 1 dose of MMR vaccine.  Pneumococcal 13-valent conjugate (PCV13) vaccine. When indicated, a person who is uncertain of his immunization history and has no record of immunization should receive the PCV13 vaccine. An adult aged 36 years or older who has certain medical conditions and has not been previously immunized should receive 1 dose of PCV13 vaccine. This PCV13 should be followed with a dose of pneumococcal polysaccharide (PPSV23) vaccine. The PPSV23 vaccine dose should be obtained at least 8 weeks after  the dose of PCV13 vaccine. An adult aged 6 years or older who has certain medical conditions and previously received 1 or more doses of PPSV23 vaccine should receive 1 dose of PCV13. The PCV13 vaccine dose should be obtained 1 or more years after the last PPSV23 vaccine dose.  Pneumococcal polysaccharide (PPSV23) vaccine. When PCV13 is also indicated, PCV13 should be obtained first. All adults aged 47 years and older should be immunized. An adult younger than age 72 years who has certain medical conditions should be immunized. Any person who resides in a nursing home or long-term care facility should be immunized. An adult smoker should be immunized. People with an immunocompromised condition and certain other conditions should receive both PCV13 and PPSV23 vaccines. People with human immunodeficiency virus (HIV) infection should be immunized as soon as possible after diagnosis. Immunization during chemotherapy or radiation therapy should be avoided. Routine use of PPSV23 vaccine is not recommended for American Indians, [place] Natives, or people younger than 65 years unless there are medical conditions that require PPSV23 vaccine. When indicated, people who have unknown immunization and have no record of immunization should receive PPSV23 vaccine. One-time revaccination 5 years after the first dose of PPSV23 is recommended for people aged 19-64 years who have chronic kidney failure, nephrotic syndrome, asplenia, or immunocompromised conditions. People who received 1-2 doses of PPSV23 before age 62 years should receive another dose of PPSV23 vaccine at age 63 years or later if at least 5 years have passed since the previous dose. Doses of PPSV23 are not needed for people immunized with PPSV23 at or after age 35 years.  Meningococcal vaccine. Adults with asplenia or persistent complement component deficiencies should receive 2 doses of quadrivalent meningococcal conjugate (MenACWY-D) vaccine. The doses should be  obtained at least 2 months apart. Microbiologists working with certain meningococcal bacteria, [place] recruits, people at risk during an outbreak, and people who travel to or live in countries with a high rate of meningitis should be immunized. A first-year college student up through age 59 years who is living in a residence hall should receive a dose if he did not receive a dose on or after his 16th birthday. Adults who have certain high-risk conditions should receive one or more doses of vaccine.  Hepatitis A vaccine. Adults who wish to be protected  from this disease, have certain high-risk conditions, work with hepatitis A-infected animals, work in hepatitis A research labs, or travel to or work in countries with a high rate of hepatitis A should be immunized. Adults who were previously unvaccinated and who anticipate close contact with an international adoptee during the first 60 days after arrival in the [place] from a country with a high rate of hepatitis A should be immunized.  Hepatitis B vaccine. Adults should be immunized if they wish to be protected from this disease, have certain high-risk conditions, may be exposed to blood or other infectious body fluids, are household contacts or sex partners of hepatitis B positive people, are clients or workers in certain care facilities, or travel to or work in countries with a high rate of hepatitis B.  Haemophilus influenzae type b (Hib) vaccine. A previously unvaccinated person with asplenia or sickle cell disease or having a scheduled splenectomy should receive 1 dose of Hib vaccine. Regardless of previous immunization, a recipient of a hematopoietic stem cell transplant should receive a 3-dose series 6-12 months after his successful transplant. Hib vaccine is not recommended for adults with HIV infection. Preventive Service / Frequency Ages 72 to 9  Blood pressure check.** / Every 1 to 2 years.  Lipid and cholesterol check.** / Every 5  years beginning at age 13.  Hepatitis C blood test.** / For any individual with known risks for hepatitis C.  Skin self-exam. / Monthly.  Influenza vaccine. / Every year.  Tetanus, diphtheria, and acellular pertussis (Tdap, Td) vaccine.** / Consult your health care provider. 1 dose of Td every 10 years.  Varicella vaccine.** / Consult your health care provider.  HPV vaccine. / 3 doses over 6 months, if 12 or younger.  Measles, mumps, rubella (MMR) vaccine.** / You need at least 1 dose of MMR if you were born in 1957 or later. You may also need a second dose.  Pneumococcal 13-valent conjugate (PCV13) vaccine.** / Consult your health care provider.  Pneumococcal polysaccharide (PPSV23) vaccine.** / 1 to 2 doses if you smoke cigarettes or if you have certain conditions.  Meningococcal vaccine.** / 1 dose if you are age 36 to 59 years and a Market researcher living in a residence hall, or have one of several medical conditions. You may also need additional booster doses.  Hepatitis A vaccine.** / Consult your health care provider.  Hepatitis B vaccine.** / Consult your health care provider.  Haemophilus influenzae type b (Hib) vaccine.** / Consult your health care provider. Ages 87 to 62  Blood pressure check.** / Every 1 to 2 years.  Lipid and cholesterol check.** / Every 5 years beginning at age 44.  Lung cancer screening. / Every year if you are aged 26-80 years and have a 30-pack-year history of smoking and currently smoke or have quit within the past 15 years. Yearly screening is stopped once you have quit smoking for at least 15 years or develop a health problem that would prevent you from having lung cancer treatment.  Fecal occult blood test (FOBT) of stool. / Every year beginning at age 87 and continuing until age 32. You may not have to do this test if you get a colonoscopy every 10 years.  Flexible sigmoidoscopy** or colonoscopy.** / Every 5 years for a flexible  sigmoidoscopy or every 10 years for a colonoscopy beginning at age 64 and continuing until age 27.  Hepatitis C blood test.** / For all people born from [birth year] through 1966  and any individual with known risks for hepatitis C.  Skin self-exam. / Monthly.  Influenza vaccine. / Every year.  Tetanus, diphtheria, and acellular pertussis (Tdap/Td) vaccine.** / Consult your health care provider. 1 dose of Td every 10 years.  Varicella vaccine.** / Consult your health care provider.  Zoster vaccine.** / 1 dose for adults aged 54 years or older.  Measles, mumps, rubella (MMR) vaccine.** / You need at least 1 dose of MMR if you were born in 1958 or later. You may also need a second dose.  Pneumococcal 13-valent conjugate (PCV13) vaccine.** / Consult your health care provider.  Pneumococcal polysaccharide (PPSV23) vaccine.** / 1 to 2 doses if you smoke cigarettes or if you have certain conditions.  Meningococcal vaccine.** / Consult your health care provider.  Hepatitis A vaccine.** / Consult your health care provider.  Hepatitis B vaccine.** / Consult your health care provider.  Haemophilus influenzae type b (Hib) vaccine.** / Consult your health care provider. Ages 3 and over  Blood pressure check.** / Every 1 to 2 years.  Lipid and cholesterol check.**/ Every 5 years beginning at age 62.  Lung cancer screening. / Every year if you are aged 20-80 years and have a 30-pack-year history of smoking and currently smoke or have quit within the past 15 years. Yearly screening is stopped once you have quit smoking for at least 15 years or develop a health problem that would prevent you from having lung cancer treatment.  Fecal occult blood test (FOBT) of stool. / Every year beginning at age 56 and continuing until age 55. You may not have to do this test if you get a colonoscopy every 10 years.  Flexible sigmoidoscopy** or colonoscopy.** / Every 5 years for a flexible sigmoidoscopy or every 10  years for a colonoscopy beginning at age 36 and continuing until age 82.  Hepatitis C blood test.** / For all people born from [birth year] through 1966 and any individual with known risks for hepatitis C.  Abdominal aortic aneurysm (AAA) screening.** / A one-time screening for ages 64 to 42 years who are current or former smokers.  Skin self-exam. / Monthly.  Influenza vaccine. / Every year.  Tetanus, diphtheria, and acellular pertussis (Tdap/Td) vaccine.** / 1 dose of Td every 10 years.  Varicella vaccine.** / Consult your health care provider.  Zoster vaccine.** / 1 dose for adults aged 28 years or older.  Pneumococcal 13-valent conjugate (PCV13) vaccine.** / Consult your health care provider.  Pneumococcal polysaccharide (PPSV23) vaccine.** / 1 dose for all adults aged 58 years and older.  Meningococcal vaccine.** / Consult your health care provider.  Hepatitis A vaccine.** / Consult your health care provider.  Hepatitis B vaccine.** / Consult your health care provider.  Haemophilus influenzae type b (Hib) vaccine.** / Consult your health care provider. **Family history and personal history of risk and conditions may change your health care provider's recommendations. Document Released: 09/01/2001 Document Revised: 07/11/2013 Document Reviewed: 12/01/2010 [facility] Patient Information 2015 [place], [place]. This information is not intended to replace advice given to you by your health care provider. Make sure you discuss any questions you have with your health care provider.

## 2014-11-12 NOTE — Assessment & Plan Note (Signed)
Patient encouraged to maintain heart healthy diet, regular exercise, adequate sleep. Consider daily probiotics. Take medications as prescribed 

## 2014-11-12 NOTE — Progress Notes (Signed)
Pre visit review using our clinic review tool, if applicable. No additional management support is needed unless otherwise documented below in the visit note. 

## 2014-11-12 NOTE — Assessment & Plan Note (Signed)
With dehydration, check a magnesium level and increase hydration

## 2014-11-12 NOTE — Assessment & Plan Note (Signed)
Has been flying internationally with increased congestion at times. Consider non sedating antihistamines daily when flared

## 2014-11-12 NOTE — Progress Notes (Signed)
Marc Jenkins  [id number] April 11, 1966 11/12/2014      Progress Note-Follow Up  Subjective  Chief Complaint  Chief Complaint  Patient presents with  . Follow-up    Pt is duew for his colonoscopy and is here to discuss it    HPI  Patient is a 49 y.o. male in today for routine medical care. Patient is in today for annual exam. Overall feeling well. Continues to travel extensively. Does note he takes numerous over-the-counter supplements and feels that his allergies are better as well as his ability to concentrate. He notes less fatigue as well. Does acknowledge some increased leg cramps recently. Denies dehydration. No recent febrile illness. Denies CP/palp/SOB/HA/congestion/fevers/GI or GU c/o. Taking meds as prescribed  Past Medical History  Diagnosis Date  . Chicken pox as a child  . Measles as a child  . Mumps as a child  . Depression   . Allergy   . Headache, cluster   . Hyperlipidemia 05/05/2011  . Preventative health care 05/05/2011  . Left hand pain 05/04/2012  . FH: colon cancer 05/04/2012  . Muscle cramp 11/12/2014    Past Surgical History  Procedure Laterality Date  . Achilles tendon surgery  2-11    Family History  Problem Relation Age of Onset  . Ulcers Mother     in colon  . Hyperlipidemia Mother   . Cancer Father 52    colon  . Hyperlipidemia Sister   . Diabetes Sister     type 2/type 2  . Obesity Sister     History   Social History  . Marital Status: Married    Spouse Name: N/A  . Number of Children: N/A  . Years of Education: N/A   Occupational History  . Not on file.   Social History Main Topics  . Smoking status: Never Smoker   . Smokeless tobacco: Never Used  . Alcohol Use: Not on file     Comment: 2 glass of wine or a beer nightly  . Drug Use: No  . Sexual Activity:    Partners: Female   Other Topics Concern  . Not on file   Social History Narrative    Current Outpatient Prescriptions on File Prior to Visit    Medication Sig Dispense Refill  . COD LIVER OIL PO Take by mouth.    . Multiple Vitamin (MULTIVITAMIN) capsule Take 1 capsule by mouth daily.    . NON FORMULARY MTHF- 2 daily    . OVER THE COUNTER MEDICATION Chloroplex- 2 daily     No current facility-administered medications on file prior to visit.    No Known Allergies  Review of Systems  Review of Systems  Constitutional: Negative for fever, chills and malaise/fatigue.  HENT: Negative for congestion, hearing loss and nosebleeds.   Eyes: Negative for discharge.  Respiratory: Negative for cough, sputum production, shortness of breath and wheezing.   Cardiovascular: Negative for chest pain, palpitations and leg swelling.  Gastrointestinal: Negative for heartburn, nausea, vomiting, abdominal pain, diarrhea, constipation and blood in stool.  Genitourinary: Negative for dysuria, urgency, frequency and hematuria.  Musculoskeletal: Positive for myalgias. Negative for back pain and falls.  Skin: Negative for rash.  Neurological: Negative for dizziness, tremors, sensory change, focal weakness, loss of consciousness, weakness and headaches.  Endo/Heme/Allergies: Negative for polydipsia. Does not bruise/bleed easily.  Psychiatric/Behavioral: Negative for depression and suicidal ideas. The patient is not nervous/anxious and does not have insomnia.     Objective  BP 130/82 mmHg  Pulse 61  Temp(Src) 98 F (36.7 C) (Oral)  Resp 16  Ht 5\' 8"  (1.727 m)  Wt 170 lb (77.111 kg)  BMI 25.85 kg/m2  SpO2 99%  Physical Exam  Physical Exam  Constitutional: He is oriented to person, place, and time and well-developed, well-nourished, and in no distress. No distress.  HENT:  Head: Normocephalic and atraumatic.  Eyes: Conjunctivae are normal.  Neck: Neck supple. No thyromegaly present.  Cardiovascular: Normal rate, regular rhythm and normal heart sounds.   No murmur heard. Pulmonary/Chest: Effort normal and breath sounds normal. No  respiratory distress.  Abdominal: He exhibits no distension and no mass. There is no tenderness.  Musculoskeletal: He exhibits no edema.  Neurological: He is alert and oriented to person, place, and time.  Skin: Skin is warm.  Psychiatric: Memory, affect and judgment normal.    Lab Results  Component Value Date   TSH 1.03 05/07/2014   Lab Results  Component Value Date   WBC 4.4 05/03/2013   HGB 13.7 05/03/2013   HCT 39.9 05/03/2013   MCV 92.1 05/03/2013   PLT 252 05/03/2013   Lab Results  Component Value Date   CREATININE 1.0 05/07/2014   BUN 14 05/07/2014   NA 141 05/07/2014   K 4.3 05/07/2014   CL 106 05/07/2014   CO2 26 05/07/2014   Lab Results  Component Value Date   ALT 21 05/07/2014   AST 26 05/07/2014   ALKPHOS 43 05/07/2014   BILITOT 0.6 05/07/2014   Lab Results  Component Value Date   CHOL 236* 05/07/2014   Lab Results  Component Value Date   HDL 48.30 05/07/2014   Lab Results  Component Value Date   LDLCALC 171* 05/07/2014   Lab Results  Component Value Date   TRIG 84.0 05/07/2014   Lab Results  Component Value Date   CHOLHDL 5 05/07/2014     Assessment & Plan  Hyperlipidemia Encouraged heart healthy diet, increase exercise, avoid trans fats, consider a krill oil cap daily   Preventative health care Patient encouraged to maintain heart healthy diet, regular exercise, adequate sleep. Consider daily probiotics. Take medications as prescribed   Cluster headache syndrome Encouraged increased hydration, 64 ounces of clear fluids daily. Minimize alcohol and caffeine. Eat small frequent meals with lean proteins and complex carbs. Avoid high and low blood sugars. Get adequate sleep, 7-8 hours a night. Needs exercise daily preferably in the morning.   Muscle cramp With dehydration, check a magnesium level and increase hydration   Allergic rhinitis Has been flying internationally with increased congestion at times. Consider non sedating  antihistamines daily when flared

## 2014-11-12 NOTE — Assessment & Plan Note (Signed)
Encouraged increased hydration, 64 ounces of clear fluids daily. Minimize alcohol and caffeine. Eat small frequent meals with lean proteins and complex carbs. Avoid high and low blood sugars. Get adequate sleep, 7-8 hours a night. Needs exercise daily preferably in the morning.  

## 2014-11-12 NOTE — Assessment & Plan Note (Signed)
Encouraged heart healthy diet, increase exercise, avoid trans fats, consider a krill oil cap daily 

## 2014-11-13 ENCOUNTER — Encounter: Payer: Self-pay | Admitting: Family Medicine

## 2014-11-26 NOTE — Telephone Encounter (Signed)
error:[id number] ° °

## 2014-12-24 ENCOUNTER — Encounter: Payer: Self-pay | Admitting: Family Medicine

## 2015-01-02 ENCOUNTER — Encounter: Payer: Self-pay | Admitting: Family Medicine

## 2015-01-07 ENCOUNTER — Ambulatory Visit (AMBULATORY_SURGERY_CENTER): Payer: Self-pay | Admitting: *Deleted

## 2015-01-07 VITALS — Ht 68.0 in | Wt 169.0 lb

## 2015-01-07 DIAGNOSIS — Z1211 Encounter for screening for malignant neoplasm of colon: Secondary | ICD-10-CM

## 2015-01-07 MED ORDER — NA SULFATE-K SULFATE-MG SULF 17.5-3.13-1.6 GM/177ML PO SOLN: ORAL | Status: DC

## 2015-01-07 NOTE — Progress Notes (Signed)
Patient denies any allergies to eggs or soy. Patient denies any problems with anesthesia/sedation. Patient denies any oxygen use at home and does not take any diet/weight loss medications. EMMI education assisgned to patient on colonoscopy, this was explained and instructions given to patient. 

## 2015-01-17 ENCOUNTER — Ambulatory Visit (AMBULATORY_SURGERY_CENTER): Payer: BLUE CROSS/BLUE SHIELD | Admitting: Internal Medicine

## 2015-01-17 ENCOUNTER — Encounter: Payer: Self-pay | Admitting: Internal Medicine

## 2015-01-17 VITALS — BP 103/66 | HR 54 | Temp 97.1°F | Resp 23 | Ht 68.0 in | Wt 169.0 lb

## 2015-01-17 DIAGNOSIS — D123 Benign neoplasm of transverse colon: Secondary | ICD-10-CM | POA: Diagnosis not present

## 2015-01-17 DIAGNOSIS — Z800 Family history of malignant neoplasm of digestive organs: Secondary | ICD-10-CM | POA: Diagnosis not present

## 2015-01-17 DIAGNOSIS — Z1211 Encounter for screening for malignant neoplasm of colon: Secondary | ICD-10-CM | POA: Diagnosis not present

## 2015-01-17 HISTORY — PX: COLONOSCOPY: SHX174

## 2015-01-17 MED ORDER — SODIUM CHLORIDE 0.9 % IV SOLN: 500.0000 mL | INTRAVENOUS | Status: DC

## 2015-01-17 NOTE — Op Note (Addendum)
[place]  [address] [place], [address]   COLONOSCOPY PROCEDURE REPORT  PATIENT: Marc, Jenkins  MR#: [id number] BIRTHDATE: 1966-01-19 , 55  yrs. old GENDER: male ENDOSCOPIST: Jerene Bears, MD REFERRED KK:XFGHW Charlett Blake, M.D. PROCEDURE DATE:  01/17/2015 PROCEDURE:   Colonoscopy, screening and Colonoscopy with snare polypectomy First Screening Colonoscopy - Avg.  risk and is 50 yrs.  old or older Yes.  Prior Negative Screening - Now for repeat screening. N/A  History of Adenoma - Now for follow-up colonoscopy & has been > or = to 3 yrs.  N/A  Polyps removed today? Yes ASA CLASS:   Class II INDICATIONS:Surveillance due to prior colonic neoplasia and FH Colon or Rectal Adenocarcinoma. (father), 1st colonoscopy MEDICATIONS: Monitored anesthesia care and Propofol 260 mg IV  DESCRIPTION OF PROCEDURE:   After the risks benefits and alternatives of the procedure were thoroughly explained, informed consent was obtained.  The digital rectal exam revealed no rectal mass.   The [id number]  endoscope was introduced through the anus and advanced to the cecum, which was identified by both the appendix and ileocecal valve. No adverse events experienced. The quality of the prep was good.  (Suprep was used)  The instrument was then slowly withdrawn as the colon was fully examined. Estimated blood loss is zero unless otherwise noted in this procedure report.   COLON FINDINGS: A sessile polyp measuring 5 mm in size with a mucous cap was found in the transverse colon.  A polypectomy was performed with a cold snare.  The resection was complete, the polyp tissue was completely retrieved and sent to histology.   The examination was otherwise normal.  Retroflexed views revealed no abnormalities. The time to cecum = 1.9 Withdrawal time = 8.0   The scope was withdrawn and the procedure completed. COMPLICATIONS: There were no immediate complications.  ENDOSCOPIC  IMPRESSION: 1.   Sessile polyp was found in the transverse colon; polypectomy was performed with a cold snare 2.   The examination was otherwise normal  RECOMMENDATIONS: 1.  Await pathology results 2.  Repeat Colonoscopy in 5 years. 3.  You will receive a letter within 1-2 weeks with the results of your biopsy as well as final recommendations.  Please call my office if you have not received a letter after 3 weeks.  eSigned:  Jerene Bears, MD 01/17/2015 9:15 AM Revised: 01/17/2015 9:15 AM  cc: Willette Alma, MD and The Patient

## 2015-01-17 NOTE — Progress Notes (Signed)
Called to room to assist during endoscopic procedure.  Patient ID and intended procedure confirmed with present staff. Received instructions for my participation in the procedure from the performing physician.  

## 2015-01-17 NOTE — Patient Instructions (Signed)
Discharge instructions given. Handout on polyps. Resume previous medications. YOU HAD AN ENDOSCOPIC PROCEDURE TODAY AT THE [facility]:   Refer to the procedure report that was given to you for any specific questions about what was found during the examination.  If the procedure report does not answer your questions, please call your gastroenterologist to clarify.  If you requested that your care partner not be given the details of your procedure findings, then the procedure report has been included in a sealed envelope for you to review at your convenience later.  YOU SHOULD EXPECT: Some feelings of bloating in the abdomen. Passage of more gas than usual.  Walking can help get rid of the air that was put into your GI tract during the procedure and reduce the bloating. If you had a lower endoscopy (such as a colonoscopy or flexible sigmoidoscopy) you may notice spotting of blood in your stool or on the toilet paper. If you underwent a bowel prep for your procedure, you may not have a normal bowel movement for a few days.  Please Note:  You might notice some irritation and congestion in your nose or some drainage.  This is from the oxygen used during your procedure.  There is no need for concern and it should clear up in a day or so.  SYMPTOMS TO REPORT IMMEDIATELY:   Following lower endoscopy (colonoscopy or flexible sigmoidoscopy):  Excessive amounts of blood in the stool  Significant tenderness or worsening of abdominal pains  Swelling of the abdomen that is new, acute  Fever of 100F or higher   For urgent or emergent issues, a gastroenterologist can be reached at any hour by calling [phone].   DIET: Your first meal following the procedure should be a small meal and then it is ok to progress to your normal diet. Heavy or fried foods are harder to digest and may make you feel nauseous or bloated.  Likewise, meals heavy in dairy and vegetables can increase bloating.  Drink  plenty of fluids but you should avoid alcoholic beverages for 24 hours.  ACTIVITY:  You should plan to take it easy for the rest of today and you should NOT DRIVE or use heavy machinery until tomorrow (because of the sedation medicines used during the test).    FOLLOW UP: Our staff will call the number listed on your records the next business day following your procedure to check on you and address any questions or concerns that you may have regarding the information given to you following your procedure. If we do not reach you, we will leave a message.  However, if you are feeling well and you are not experiencing any problems, there is no need to return our call.  We will assume that you have returned to your regular daily activities without incident.  If any biopsies were taken you will be contacted by phone or by letter within the next 1-3 weeks.  Please call us at [phone] if you have not heard about the biopsies in 3 weeks.    SIGNATURES/CONFIDENTIALITY: You and/or your care partner have signed paperwork which will be entered into your electronic medical record.  These signatures attest to the fact that that the information above on your After Visit Summary has been reviewed and is understood.  Full responsibility of the confidentiality of this discharge information lies with you and/or your care-partner. 

## 2015-01-17 NOTE — Progress Notes (Signed)
A/ox3 pleased with MAC, report to Celia RN 

## 2015-01-18 ENCOUNTER — Telehealth: Payer: Self-pay | Admitting: Emergency Medicine

## 2015-01-18 NOTE — Telephone Encounter (Signed)
  Follow up Call-  Call back number 01/17/2015  Post procedure Call Back phone  # [phone]  Permission to leave phone message Yes     Patient questions:  Do you have a fever, pain , or abdominal swelling? No. Pain Score  0 *  Have you tolerated food without any problems? Yes.    Have you been able to return to your normal activities? Yes.    Do you have any questions about your discharge instructions: Diet   No. Medications  No. Follow up visit  No.  Do you have questions or concerns about your Care? No.  Actions: * If pain score is 4 or above: No action needed, pain <4.

## 2015-01-23 ENCOUNTER — Encounter: Payer: Self-pay | Admitting: Family Medicine

## 2015-01-24 ENCOUNTER — Encounter: Payer: Self-pay | Admitting: Internal Medicine

## 2015-07-04 ENCOUNTER — Ambulatory Visit (INDEPENDENT_AMBULATORY_CARE_PROVIDER_SITE_OTHER): Payer: BLUE CROSS/BLUE SHIELD | Admitting: Family Medicine

## 2015-07-04 ENCOUNTER — Encounter: Payer: Self-pay | Admitting: Family Medicine

## 2015-07-04 VITALS — BP 122/72 | HR 72 | Temp 98.6°F | Resp 16 | Ht 68.0 in | Wt 167.4 lb

## 2015-07-04 DIAGNOSIS — J029 Acute pharyngitis, unspecified: Secondary | ICD-10-CM | POA: Diagnosis not present

## 2015-07-04 DIAGNOSIS — J069 Acute upper respiratory infection, unspecified: Secondary | ICD-10-CM | POA: Diagnosis not present

## 2015-07-04 DIAGNOSIS — R509 Fever, unspecified: Secondary | ICD-10-CM

## 2015-07-04 LAB — POCT RAPID STREP A (OFFICE): Rapid Strep A Screen: NEGATIVE

## 2015-07-04 LAB — POCT INFLUENZA A/B
INFLUENZA A, POC: NEGATIVE
INFLUENZA B, POC: NEGATIVE

## 2015-07-04 NOTE — Assessment & Plan Note (Signed)
New.  No evidence of bacterial infxn on PE and both flu and strep were negative today.  No need or abx.  Reviewed supportive care and red flags that should prompt return.  Pt expressed understanding and is in agreement w/ plan.

## 2015-07-04 NOTE — Progress Notes (Signed)
   Subjective:    Patient ID: Murilo Pattison, male    DOB: 04-Sep-1965, 49 y.o.   MRN: [id number]  HPI URI- sxs started 'late last week' w/ sore throat, chills, nasal congestion.  Pt thought he was improving but yesterday again had chills and had to leave work early.  Tm 100 this AM.  No sinus pain/pressure.  Mild drainage.  + body aches.  No nausea.  Mild, intermittent cough.  + sick contacts.   Review of Systems For ROS see HPI     Objective:   Physical Exam  Constitutional: He is oriented to person, place, and time. He appears well-developed and well-nourished. No distress.  HENT:  Head: Normocephalic and atraumatic.  No TTP over sinuses + turbinate edema + PND TMs normal bilaterally  Eyes: Conjunctivae and EOM are normal. Pupils are equal, round, and reactive to light.  Neck: Normal range of motion. Neck supple.  Cardiovascular: Normal rate, regular rhythm and normal heart sounds.   Pulmonary/Chest: Effort normal and breath sounds normal. No respiratory distress. He has no wheezes.  Lymphadenopathy:    He has no cervical adenopathy.  Neurological: He is alert and oriented to person, place, and time.  Skin: Skin is warm and dry.  Psychiatric: He has a normal mood and affect. His behavior is normal. Thought content normal.  Vitals reviewed.         Assessment & Plan:

## 2015-07-04 NOTE — Progress Notes (Signed)
Pre visit review using our clinic review tool, if applicable. No additional management support is needed unless otherwise documented below in the visit note. 

## 2015-07-04 NOTE — Patient Instructions (Signed)
No evidence of strep of flu- this is great news! Drink plenty of fluids REST! Mucinex DM for cough/congestion Alternate tylenol/ibuprofen for pain/fever Call with any questions or concerns- particularly if symptoms change or worsen Hang in there!!! Happy Holidays!!!

## 2015-10-04 ENCOUNTER — Ambulatory Visit (INDEPENDENT_AMBULATORY_CARE_PROVIDER_SITE_OTHER): Payer: BLUE CROSS/BLUE SHIELD | Admitting: Internal Medicine

## 2015-10-04 ENCOUNTER — Encounter: Payer: Self-pay | Admitting: Internal Medicine

## 2015-10-04 VITALS — BP 118/74 | HR 74 | Temp 98.1°F | Ht 68.0 in | Wt 168.1 lb

## 2015-10-04 DIAGNOSIS — J101 Influenza due to other identified influenza virus with other respiratory manifestations: Secondary | ICD-10-CM | POA: Diagnosis not present

## 2015-10-04 LAB — POCT INFLUENZA A/B
INFLUENZA B, POC: POSITIVE — AB
Influenza A, POC: NEGATIVE

## 2015-10-04 LAB — POCT RAPID STREP A (OFFICE): Rapid Strep A Screen: NEGATIVE

## 2015-10-04 MED ORDER — OSELTAMIVIR PHOSPHATE 75 MG PO CAPS: 75.0000 mg | ORAL_CAPSULE | Freq: Two times a day (BID) | ORAL | Status: DC

## 2015-10-04 NOTE — Progress Notes (Signed)
Subjective:    Patient ID: Marc Jenkins, male    DOB: 12-Aug-1965, 50 y.o.   MRN: [id number]  DOS:  10/04/2015 Type of visit - description : Acute visit Interval history: Symptoms started 2 days ago with chills, generalized aches, sore throat, headache, malaise. He had a fever up to 102.0 that decreased with ibuprofen. Also, sometimes persistent and intense cough with clear sputum production. Daughter has been sick with a viral illness.   Review of Systems  Denies chest pain or difficulty breathing No rash No nausea or vomiting + Decreased appetite + Mild sinus congestion.  Past Medical History  Diagnosis Date  . Chicken pox as a child  . Measles as a child  . Mumps as a child  . Depression   . Allergy   . Headache, cluster   . Hyperlipidemia 05/05/2011  . Preventative health care 05/05/2011  . Left hand pain 05/04/2012  . FH: colon cancer 05/04/2012  . Muscle cramp 11/12/2014    Past Surgical History  Procedure Laterality Date  . Achilles tendon surgery  2-11  . Tonsillectomy and adenoidectomy      Social History   Social History  . Marital Status: Married    Spouse Name: N/A  . Number of Children: N/A  . Years of Education: N/A   Occupational History  . Not on file.   Social History Main Topics  . Smoking status: Never Smoker   . Smokeless tobacco: Never Used  . Alcohol Use: Yes     Comment: 2 glass of wine or a beer nightly  . Drug Use: No  . Sexual Activity:    Partners: Female   Other Topics Concern  . Not on file   Social History Narrative        Medication List       This list is accurate as of: 10/04/15 11:59 PM.  Always use your most recent med list.               COD LIVER OIL PO  Take by mouth.     multivitamin capsule  Take 1 capsule by mouth daily.     NON FORMULARY  MTHR=B 6- 2 daily     oseltamivir 75 MG capsule  Commonly known as:  TAMIFLU  Take 1 capsule (75 mg total) by mouth 2 (two) times daily.     OVER  THE COUNTER MEDICATION  Chloroplex- 2 daily     pyridOXINE 100 MG tablet  Commonly known as:  VITAMIN B-6  Take 100 mg by mouth daily.     Vitamin D3 5000 units Tabs  Take by mouth.           Objective:   Physical Exam BP 118/74 mmHg  Pulse 74  Temp(Src) 98.1 F (36.7 C) (Oral)  Ht 5\' 8"  (1.727 m)  Wt 168 lb 2 oz (76.261 kg)  BMI 25.57 kg/m2  SpO2 97% General:   Well developed, well nourished . NAD. Nontoxic appearing HEENT:  Normocephalic . Face symmetric, atraumatic. TMs normal, throat no red, nose is slightly congested, sinuses no TTP Neck: Supple Lungs:  CTA B Normal respiratory effort, no intercostal retractions, no accessory muscle use. Heart: RRR,  no murmur.  No pretibial edema bilaterally  Skin: Not pale. Not jaundice Neurologic:  alert & oriented X3.  Speech normal, gait appropriate for age and unassisted Psych--  Cognition and judgment appear intact.  Cooperative with normal attention span and concentration.  Behavior appropriate. No anxious or  depressed appearing.      Assessment & Plan:   Influenza B: Patient presents with a viral syndrome, strep test negative, influenza B+. He has the flu, diagnosis, treatment and potential complications discussed with the patient. Knows he is quite contagious. Plan: Tamiflu, see instructions.

## 2015-10-04 NOTE — Progress Notes (Signed)
Pre visit review using our clinic review tool, if applicable. No additional management support is needed unless otherwise documented below in the visit note. 

## 2015-10-04 NOTE — Patient Instructions (Signed)
Rest, fluids , tylenol  For cough:  Take Mucinex DM twice a day as needed until better  For nasal congestion: Use OTC Nasocort or Flonase : 2 nasal sprays on each side of the nose in the morning until you feel better   Avoid decongestants such as  Pseudoephedrine or phenylephrine     Take TAMIFLU as prescribed    Call if not gradually better over the next  10 days  Call anytime if the symptoms are severe, you have high fever, short of breath, chest pain, any rash or severe  headache.

## 2015-11-11 ENCOUNTER — Encounter: Payer: Self-pay | Admitting: Family Medicine

## 2015-11-12 DIAGNOSIS — M9903 Segmental and somatic dysfunction of lumbar region: Secondary | ICD-10-CM | POA: Diagnosis not present

## 2015-11-12 DIAGNOSIS — M791 Myalgia: Secondary | ICD-10-CM | POA: Diagnosis not present

## 2015-11-12 DIAGNOSIS — M9902 Segmental and somatic dysfunction of thoracic region: Secondary | ICD-10-CM | POA: Diagnosis not present

## 2015-11-12 DIAGNOSIS — M9901 Segmental and somatic dysfunction of cervical region: Secondary | ICD-10-CM | POA: Diagnosis not present

## 2015-11-18 ENCOUNTER — Encounter: Payer: BLUE CROSS/BLUE SHIELD | Admitting: Family Medicine

## 2015-12-16 DIAGNOSIS — M9903 Segmental and somatic dysfunction of lumbar region: Secondary | ICD-10-CM | POA: Diagnosis not present

## 2015-12-16 DIAGNOSIS — M9902 Segmental and somatic dysfunction of thoracic region: Secondary | ICD-10-CM | POA: Diagnosis not present

## 2015-12-16 DIAGNOSIS — M791 Myalgia: Secondary | ICD-10-CM | POA: Diagnosis not present

## 2015-12-16 DIAGNOSIS — M9901 Segmental and somatic dysfunction of cervical region: Secondary | ICD-10-CM | POA: Diagnosis not present

## 2016-01-09 DIAGNOSIS — M9902 Segmental and somatic dysfunction of thoracic region: Secondary | ICD-10-CM | POA: Diagnosis not present

## 2016-01-09 DIAGNOSIS — M9903 Segmental and somatic dysfunction of lumbar region: Secondary | ICD-10-CM | POA: Diagnosis not present

## 2016-01-09 DIAGNOSIS — M791 Myalgia: Secondary | ICD-10-CM | POA: Diagnosis not present

## 2016-01-09 DIAGNOSIS — M9901 Segmental and somatic dysfunction of cervical region: Secondary | ICD-10-CM | POA: Diagnosis not present

## 2016-01-10 ENCOUNTER — Other Ambulatory Visit: Payer: Self-pay | Admitting: Family Medicine

## 2016-01-10 ENCOUNTER — Encounter: Payer: Self-pay | Admitting: Family Medicine

## 2016-01-10 DIAGNOSIS — D72819 Decreased white blood cell count, unspecified: Secondary | ICD-10-CM

## 2016-01-10 DIAGNOSIS — Z0000 Encounter for general adult medical examination without abnormal findings: Secondary | ICD-10-CM

## 2016-01-10 DIAGNOSIS — E785 Hyperlipidemia, unspecified: Secondary | ICD-10-CM

## 2016-01-13 ENCOUNTER — Other Ambulatory Visit (INDEPENDENT_AMBULATORY_CARE_PROVIDER_SITE_OTHER): Payer: BLUE CROSS/BLUE SHIELD

## 2016-01-13 DIAGNOSIS — E785 Hyperlipidemia, unspecified: Secondary | ICD-10-CM | POA: Diagnosis not present

## 2016-01-13 DIAGNOSIS — D72819 Decreased white blood cell count, unspecified: Secondary | ICD-10-CM | POA: Diagnosis not present

## 2016-01-13 DIAGNOSIS — Z0000 Encounter for general adult medical examination without abnormal findings: Secondary | ICD-10-CM

## 2016-01-13 LAB — CBC WITH DIFFERENTIAL/PLATELET
BASOS PCT: 0.5 % (ref 0.0–3.0)
Basophils Absolute: 0 10*3/uL (ref 0.0–0.1)
EOS ABS: 0.1 10*3/uL (ref 0.0–0.7)
EOS PCT: 1.5 % (ref 0.0–5.0)
HEMATOCRIT: 38.8 % — AB (ref 39.0–52.0)
HEMOGLOBIN: 13.1 g/dL (ref 13.0–17.0)
LYMPHS PCT: 44.2 % (ref 12.0–46.0)
Lymphs Abs: 2.1 10*3/uL (ref 0.7–4.0)
MCHC: 33.7 g/dL (ref 30.0–36.0)
MCV: 90.9 fl (ref 78.0–100.0)
Monocytes Absolute: 0.6 10*3/uL (ref 0.1–1.0)
Monocytes Relative: 12.4 % — ABNORMAL HIGH (ref 3.0–12.0)
Neutro Abs: 2 10*3/uL (ref 1.4–7.7)
Neutrophils Relative %: 41.4 % — ABNORMAL LOW (ref 43.0–77.0)
Platelets: 247 10*3/uL (ref 150.0–400.0)
RBC: 4.27 Mil/uL (ref 4.22–5.81)
RDW: 13.9 % (ref 11.5–15.5)
WBC: 4.8 10*3/uL (ref 4.0–10.5)

## 2016-01-13 LAB — LIPID PANEL
CHOL/HDL RATIO: 4
Cholesterol: 236 mg/dL — ABNORMAL HIGH (ref 0–200)
HDL: 60.1 mg/dL (ref >39.00)
LDL Cholesterol: 149 mg/dL — ABNORMAL HIGH (ref 0–99)
NONHDL: 175.65
Triglycerides: 132 mg/dL (ref 0.0–149.0)
VLDL: 26.4 mg/dL (ref 0.0–40.0)

## 2016-01-13 LAB — COMPREHENSIVE METABOLIC PANEL
ALBUMIN: 4.4 g/dL (ref 3.5–5.2)
ALK PHOS: 38 U/L — AB (ref 39–117)
ALT: 17 U/L (ref 0–53)
AST: 20 U/L (ref 0–37)
BUN: 19 mg/dL (ref 6–23)
CALCIUM: 9.4 mg/dL (ref 8.4–10.5)
CHLORIDE: 104 meq/L (ref 96–112)
CO2: 27 mEq/L (ref 19–32)
Creatinine, Ser: 1.06 mg/dL (ref 0.40–1.50)
GFR: 78.19 mL/min (ref >60.00)
Glucose, Bld: 85 mg/dL (ref 70–99)
Potassium: 4.3 mEq/L (ref 3.5–5.1)
Sodium: 138 mEq/L (ref 135–145)
TOTAL PROTEIN: 7 g/dL (ref 6.0–8.3)
Total Bilirubin: 0.5 mg/dL (ref 0.2–1.2)

## 2016-01-13 LAB — VITAMIN D 25 HYDROXY (VIT D DEFICIENCY, FRACTURES): VITD: 59.38 ng/mL (ref 30.00–100.00)

## 2016-01-13 LAB — TSH: TSH: 1.76 u[IU]/mL (ref 0.35–4.50)

## 2016-01-17 ENCOUNTER — Ambulatory Visit (INDEPENDENT_AMBULATORY_CARE_PROVIDER_SITE_OTHER): Payer: BLUE CROSS/BLUE SHIELD | Admitting: Family Medicine

## 2016-01-17 ENCOUNTER — Encounter: Payer: Self-pay | Admitting: Family Medicine

## 2016-01-17 VITALS — BP 128/84 | HR 68 | Temp 98.3°F | Ht 69.0 in | Wt 167.1 lb

## 2016-01-17 DIAGNOSIS — E785 Hyperlipidemia, unspecified: Secondary | ICD-10-CM | POA: Diagnosis not present

## 2016-01-17 DIAGNOSIS — D649 Anemia, unspecified: Secondary | ICD-10-CM | POA: Diagnosis not present

## 2016-01-17 DIAGNOSIS — Z0000 Encounter for general adult medical examination without abnormal findings: Secondary | ICD-10-CM | POA: Diagnosis not present

## 2016-01-17 NOTE — Assessment & Plan Note (Signed)
Encouraged heart healthy diet, increase exercise, avoid trans fats, consider a krill oil cap daily 

## 2016-01-17 NOTE — Progress Notes (Signed)
Pre visit review using our clinic review tool, if applicable. No additional management support is needed unless otherwise documented below in the visit note. 

## 2016-01-17 NOTE — Assessment & Plan Note (Signed)
Patient encouraged to maintain heart healthy diet, regular exercise, adequate sleep. Consider daily probiotics. Take medications as prescribed. Given and reviewed copy of ACP documents from [place] Secretary of State and encouraged to complete and return 

## 2016-01-17 NOTE — Patient Instructions (Signed)

## 2016-01-22 NOTE — Progress Notes (Signed)
Patient ID: Marc Jenkins, male   DOB: October 26, 1965, 50 y.o.   MRN: [id number]   Subjective:    Patient ID: Marc Jenkins, male    DOB: 01-Oct-1965, 50 y.o.   MRN: [id number]  Chief Complaint  Patient presents with  . Annual Exam    HPI Patient is in today for annual exam. He is doing well for the most part. No recent illness or acute concerns. His greatest complaint is musculoskeletal in nature. He has been following with chiropractor for his neck and shoulder pain. It does help some but pain returns. He travels long distances for work and that is physically draining. Denies CP/palp/SOB/HA/congestion/fevers/GI or GU c/o. Taking meds as prescribed Past Medical History  Diagnosis Date  . Chicken pox as a child  . Measles as a child  . Mumps as a child  . Depression   . Allergy   . Headache, cluster   . Hyperlipidemia 05/05/2011  . Preventative health care 05/05/2011  . Left hand pain 05/04/2012  . FH: colon cancer 05/04/2012  . Muscle cramp 11/12/2014  . H/O measles   . H/O mumps     Past Surgical History  Procedure Laterality Date  . Achilles tendon surgery  2-11  . Tonsillectomy and adenoidectomy      Family History  Problem Relation Age of Onset  . Ulcers Mother     in colon  . Hyperlipidemia Mother   . Colon cancer Father 58  . Hyperlipidemia Sister   . Diabetes Sister     type 2/type 2  . Obesity Sister     Social History   Social History  . Marital Status: Married    Spouse Name: N/A  . Number of Children: N/A  . Years of Education: N/A   Occupational History  . Not on file.   Social History Main Topics  . Smoking status: Never Smoker   . Smokeless tobacco: Never Used  . Alcohol Use: Yes     Comment: 2 glass of wine or a beer nightly  . Drug Use: No  . Sexual Activity:    Partners: Female   Other Topics Concern  . Not on file   Social History Narrative    Outpatient Prescriptions Prior to Visit  Medication Sig Dispense Refill  .  Cholecalciferol (VITAMIN D3) 5000 UNITS TABS Take by mouth.    . COD LIVER OIL PO Take by mouth.    . Multiple Vitamin (MULTIVITAMIN) capsule Take 1 capsule by mouth daily.    . NON FORMULARY MTHR=B 6- 2 daily    . OVER THE COUNTER MEDICATION Chloroplex- 2 daily    . pyridOXINE (VITAMIN B-6) 100 MG tablet Take 100 mg by mouth daily.    Marland Kitchen oseltamivir (TAMIFLU) 75 MG capsule Take 1 capsule (75 mg total) by mouth 2 (two) times daily. 10 capsule 0   No facility-administered medications prior to visit.    No Known Allergies  Review of Systems  Constitutional: Negative for fever, chills and malaise/fatigue.  HENT: Negative for congestion and hearing loss.   Eyes: Negative for discharge.  Respiratory: Negative for cough, sputum production and shortness of breath.   Cardiovascular: Negative for chest pain, palpitations and leg swelling.  Gastrointestinal: Negative for heartburn, nausea, vomiting, abdominal pain, diarrhea, constipation and blood in stool.  Genitourinary: Negative for dysuria, urgency, frequency and hematuria.  Musculoskeletal: Positive for myalgias. Negative for back pain and falls.  Skin: Negative for rash.  Neurological: Negative for dizziness, sensory change,  loss of consciousness, weakness and headaches.  Endo/Heme/Allergies: Negative for environmental allergies. Does not bruise/bleed easily.  Psychiatric/Behavioral: Negative for depression and suicidal ideas. The patient is not nervous/anxious and does not have insomnia.        Objective:    Physical Exam  Constitutional: He is oriented to person, place, and time. He appears well-developed and well-nourished. No distress.  HENT:  Head: Normocephalic and atraumatic.  Eyes: Conjunctivae are normal.  Neck: Neck supple. No thyromegaly present.  Cardiovascular: Normal rate, regular rhythm and normal heart sounds.   No murmur heard. Pulmonary/Chest: Effort normal and breath sounds normal. No respiratory distress. He has  no wheezes.  Abdominal: Soft. Bowel sounds are normal. He exhibits no mass. There is no tenderness.  Musculoskeletal: He exhibits no edema.  Lymphadenopathy:    He has no cervical adenopathy.  Neurological: He is alert and oriented to person, place, and time.  Skin: Skin is warm and dry.  Left neck 1 cm raised brown lesion.   Psychiatric: He has a normal mood and affect. His behavior is normal.    BP 128/84 mmHg  Pulse 68  Temp(Src) 98.3 F (36.8 C) (Oral)  Ht 5\' 9"  (1.753 m)  Wt 167 lb 2 oz (75.807 kg)  BMI 24.67 kg/m2  SpO2 95% Wt Readings from Last 3 Encounters:  01/17/16 167 lb 2 oz (75.807 kg)  10/04/15 168 lb 2 oz (76.261 kg)  07/04/15 167 lb 6 oz (75.921 kg)     Lab Results  Component Value Date   WBC 4.8 01/13/2016   HGB 13.1 01/13/2016   HCT 38.8* 01/13/2016   PLT 247.0 01/13/2016   GLUCOSE 85 01/13/2016   CHOL 236* 01/13/2016   TRIG 132.0 01/13/2016   HDL 60.10 01/13/2016   LDLDIRECT 201.4 05/04/2012   LDLCALC 149* 01/13/2016   ALT 17 01/13/2016   AST 20 01/13/2016   NA 138 01/13/2016   K 4.3 01/13/2016   CL 104 01/13/2016   CREATININE 1.06 01/13/2016   BUN 19 01/13/2016   CO2 27 01/13/2016   TSH 1.76 01/13/2016   PSA 1.36 11/12/2014    Lab Results  Component Value Date   TSH 1.76 01/13/2016   Lab Results  Component Value Date   WBC 4.8 01/13/2016   HGB 13.1 01/13/2016   HCT 38.8* 01/13/2016   MCV 90.9 01/13/2016   PLT 247.0 01/13/2016   Lab Results  Component Value Date   NA 138 01/13/2016   K 4.3 01/13/2016   CO2 27 01/13/2016   GLUCOSE 85 01/13/2016   BUN 19 01/13/2016   CREATININE 1.06 01/13/2016   BILITOT 0.5 01/13/2016   ALKPHOS 38* 01/13/2016   AST 20 01/13/2016   ALT 17 01/13/2016   PROT 7.0 01/13/2016   ALBUMIN 4.4 01/13/2016   CALCIUM 9.4 01/13/2016   GFR 78.19 01/13/2016   Lab Results  Component Value Date   CHOL 236* 01/13/2016   Lab Results  Component Value Date   HDL 60.10 01/13/2016   Lab Results    Component Value Date   LDLCALC 149* 01/13/2016   Lab Results  Component Value Date   TRIG 132.0 01/13/2016   Lab Results  Component Value Date   CHOLHDL 4 01/13/2016   No results found for: HGBA1C     Assessment & Plan:   Problem List Items Addressed This Visit    Hyperlipidemia - Primary    Encouraged heart healthy diet, increase exercise, avoid trans fats, consider a krill oil cap daily  Relevant Orders   CBC w/Diff   TSH   Comprehensive metabolic panel   Lipid panel   Preventative health care    Patient encouraged to maintain heart healthy diet, regular exercise, adequate sleep. Consider daily probiotics. Take medications as prescribed. Given and reviewed copy of ACP documents from [employer] and encouraged to complete and return      Relevant Orders   CBC w/Diff   TSH   Comprehensive metabolic panel   Lipid panel    Other Visit Diagnoses    Anemia, unspecified anemia type        Relevant Orders    CBC w/Diff    CBC w/Diff    TSH    Comprehensive metabolic panel    Lipid panel    Retic       I have discontinued Mr. Zagal's oseltamivir. I am also having him maintain his COD LIVER OIL PO, multivitamin, OVER THE COUNTER MEDICATION, NON FORMULARY, Vitamin D3, and pyridOXINE.  No orders of the defined types were placed in this encounter.     Penni Homans, MD

## 2016-01-28 DIAGNOSIS — M9903 Segmental and somatic dysfunction of lumbar region: Secondary | ICD-10-CM | POA: Diagnosis not present

## 2016-01-28 DIAGNOSIS — M9902 Segmental and somatic dysfunction of thoracic region: Secondary | ICD-10-CM | POA: Diagnosis not present

## 2016-01-28 DIAGNOSIS — M9901 Segmental and somatic dysfunction of cervical region: Secondary | ICD-10-CM | POA: Diagnosis not present

## 2016-01-28 DIAGNOSIS — M791 Myalgia: Secondary | ICD-10-CM | POA: Diagnosis not present

## 2016-03-02 DIAGNOSIS — M9901 Segmental and somatic dysfunction of cervical region: Secondary | ICD-10-CM | POA: Diagnosis not present

## 2016-03-02 DIAGNOSIS — M791 Myalgia: Secondary | ICD-10-CM | POA: Diagnosis not present

## 2016-03-02 DIAGNOSIS — M9903 Segmental and somatic dysfunction of lumbar region: Secondary | ICD-10-CM | POA: Diagnosis not present

## 2016-03-02 DIAGNOSIS — M9902 Segmental and somatic dysfunction of thoracic region: Secondary | ICD-10-CM | POA: Diagnosis not present

## 2016-04-07 DIAGNOSIS — M9902 Segmental and somatic dysfunction of thoracic region: Secondary | ICD-10-CM | POA: Diagnosis not present

## 2016-04-07 DIAGNOSIS — M791 Myalgia: Secondary | ICD-10-CM | POA: Diagnosis not present

## 2016-04-07 DIAGNOSIS — M9905 Segmental and somatic dysfunction of pelvic region: Secondary | ICD-10-CM | POA: Diagnosis not present

## 2016-04-07 DIAGNOSIS — M9903 Segmental and somatic dysfunction of lumbar region: Secondary | ICD-10-CM | POA: Diagnosis not present

## 2016-04-08 ENCOUNTER — Encounter: Payer: Self-pay | Admitting: Family Medicine

## 2016-04-08 ENCOUNTER — Telehealth: Payer: Self-pay | Admitting: Family Medicine

## 2016-04-08 NOTE — Telephone Encounter (Signed)
Pt says that he received a message stating that he need labs. Pt  would like to have lab orders place at the [place] office. He says that it is closer to his home.

## 2016-04-09 ENCOUNTER — Other Ambulatory Visit: Payer: Self-pay | Admitting: Family Medicine

## 2016-04-09 DIAGNOSIS — R748 Abnormal levels of other serum enzymes: Secondary | ICD-10-CM

## 2016-04-09 DIAGNOSIS — D509 Iron deficiency anemia, unspecified: Secondary | ICD-10-CM

## 2016-04-09 DIAGNOSIS — E785 Hyperlipidemia, unspecified: Secondary | ICD-10-CM

## 2016-04-09 NOTE — Telephone Encounter (Signed)
I was not here yesterday and did not enter these labs.  Advise on changing and placing at [facility] and what to order.

## 2016-04-09 NOTE — Telephone Encounter (Signed)
Cbc, lipid and cmp for anemia, hyperlipidemia and abnormal alk phos. Order per patient request at [facility] but not til October because it has to be greater than 90 days from last check 

## 2016-04-09 NOTE — Telephone Encounter (Signed)
Orders placed at [facility] office and patient informed of instructions.

## 2016-04-13 DIAGNOSIS — M9903 Segmental and somatic dysfunction of lumbar region: Secondary | ICD-10-CM | POA: Diagnosis not present

## 2016-04-13 DIAGNOSIS — M791 Myalgia: Secondary | ICD-10-CM | POA: Diagnosis not present

## 2016-04-13 DIAGNOSIS — M9902 Segmental and somatic dysfunction of thoracic region: Secondary | ICD-10-CM | POA: Diagnosis not present

## 2016-04-13 DIAGNOSIS — M9905 Segmental and somatic dysfunction of pelvic region: Secondary | ICD-10-CM | POA: Diagnosis not present

## 2016-04-15 ENCOUNTER — Other Ambulatory Visit: Payer: Self-pay | Admitting: Family Medicine

## 2016-04-15 ENCOUNTER — Encounter: Payer: Self-pay | Admitting: Family Medicine

## 2016-04-15 DIAGNOSIS — Z831 Family history of other infectious and parasitic diseases: Secondary | ICD-10-CM

## 2016-04-16 DIAGNOSIS — M545 Low back pain: Secondary | ICD-10-CM | POA: Diagnosis not present

## 2016-04-21 ENCOUNTER — Encounter: Payer: BLUE CROSS/BLUE SHIELD | Admitting: Family Medicine

## 2016-04-24 ENCOUNTER — Encounter: Payer: Self-pay | Admitting: Family Medicine

## 2016-04-24 ENCOUNTER — Ambulatory Visit (INDEPENDENT_AMBULATORY_CARE_PROVIDER_SITE_OTHER): Payer: BLUE CROSS/BLUE SHIELD | Admitting: Family Medicine

## 2016-04-24 ENCOUNTER — Ambulatory Visit (HOSPITAL_BASED_OUTPATIENT_CLINIC_OR_DEPARTMENT_OTHER)
Admission: RE | Admit: 2016-04-24 | Discharge: 2016-04-24 | Disposition: A | Discharge status: Home / Self Care | Payer: BLUE CROSS/BLUE SHIELD | Source: Ambulatory Visit | Attending: Family Medicine | Admitting: Family Medicine

## 2016-04-24 VITALS — BP 122/82 | HR 66 | Temp 97.9°F | Wt 167.4 lb

## 2016-04-24 DIAGNOSIS — M5136 Other intervertebral disc degeneration, lumbar region: Secondary | ICD-10-CM | POA: Insufficient documentation

## 2016-04-24 DIAGNOSIS — M5440 Lumbago with sciatica, unspecified side: Secondary | ICD-10-CM

## 2016-04-24 DIAGNOSIS — M47816 Spondylosis without myelopathy or radiculopathy, lumbar region: Secondary | ICD-10-CM | POA: Diagnosis not present

## 2016-04-24 MED ORDER — METHYLPREDNISOLONE ACETATE 40 MG/ML IJ SUSP
40.0000 mg | Freq: Once | INTRAMUSCULAR | Status: AC
  Administered 2016-04-24: 40 mg via INTRAMUSCULAR

## 2016-04-24 MED ORDER — METHYLPREDNISOLONE 4 MG PO TABS: ORAL_TABLET | ORAL | 0 refills | Status: DC

## 2016-04-24 MED ORDER — TIZANIDINE HCL 4 MG PO TABS: 4.0000 mg | ORAL_TABLET | Freq: Three times a day (TID) | ORAL | 1 refills | Status: DC | PRN

## 2016-04-24 NOTE — Progress Notes (Signed)
Pre visit review using our clinic review tool, if applicable. No additional management support is needed unless otherwise documented below in the visit note. 

## 2016-04-24 NOTE — Patient Instructions (Addendum)
Tylenol/Acetaminophen max of 3000 mg in 24 hours No Advil/Motrin/Ibuprofen or Aleve/Naproxen while on steroid Pain patch with Lidocaine at bed, Aspercreme, Icy Hot Salon Pas In morning Salon Pas old school patch   Back Pain, Adult Back pain is very common in adults.The cause of back pain is rarely dangerous and the pain often gets better over time.The cause of your back pain may not be known. Some common causes of back pain include:  Strain of the muscles or ligaments supporting the spine.  Wear and tear (degeneration) of the spinal disks.  Arthritis.  Direct injury to the back. For many people, back pain may return. Since back pain is rarely dangerous, most people can learn to manage this condition on their own. HOME CARE INSTRUCTIONS Watch your back pain for any changes. The following actions may help to lessen any discomfort you are feeling:  Remain active. It is stressful on your back to sit or stand in one place for long periods of time. Do not sit, drive, or stand in one place for more than 30 minutes at a time. Take short walks on even surfaces as soon as you are able.Try to increase the length of time you walk each day.  Exercise regularly as directed by your health care provider. Exercise helps your back heal faster. It also helps avoid future injury by keeping your muscles strong and flexible.  Do not stay in bed.Resting more than 1-2 days can delay your recovery.  Pay attention to your body when you bend and lift. The most comfortable positions are those that put less stress on your recovering back. Always use proper lifting techniques, including:  Bending your knees.  Keeping the load close to your body.  Avoiding twisting.  Find a comfortable position to sleep. Use a firm mattress and lie on your side with your knees slightly bent. If you lie on your back, put a pillow under your knees.  Avoid feeling anxious or stressed.Stress increases muscle tension and can  worsen back pain.It is important to recognize when you are anxious or stressed and learn ways to manage it, such as with exercise.  Take medicines only as directed by your health care provider. Over-the-counter medicines to reduce pain and inflammation are often the most helpful.Your health care provider may prescribe muscle relaxant drugs.These medicines help dull your pain so you can more quickly return to your normal activities and healthy exercise.  Apply ice to the injured area:  Put ice in a plastic bag.  Place a towel between your skin and the bag.  Leave the ice on for 20 minutes, 2-3 times a day for the first 2-3 days. After that, ice and heat may be alternated to reduce pain and spasms.  Maintain a healthy weight. Excess weight puts extra stress on your back and makes it difficult to maintain good posture. SEEK MEDICAL CARE IF:  You have pain that is not relieved with rest or medicine.  You have increasing pain going down into the legs or buttocks.  You have pain that does not improve in one week.  You have night pain.  You lose weight.  You have a fever or chills. SEEK IMMEDIATE MEDICAL CARE IF:   You develop new bowel or bladder control problems.  You have unusual weakness or numbness in your arms or legs.  You develop nausea or vomiting.  You develop abdominal pain.  You feel faint.   This information is not intended to replace advice given to you  by your health care provider. Make sure you discuss any questions you have with your health care provider.   Document Released: 07/06/2005 Document Revised: 07/27/2014 Document Reviewed: 11/07/2013 Elsevier Interactive Patient Education [employer].

## 2016-04-25 DIAGNOSIS — M5440 Lumbago with sciatica, unspecified side: Secondary | ICD-10-CM | POA: Insufficient documentation

## 2016-04-25 NOTE — Assessment & Plan Note (Signed)
Encouraged to alternate moist heat and ice, stay as active as tolerated. Given a Depo Medrol shot in office and started on Medrol dosepak the next day. Use Lidocaine patches prn, Tylenol prn and Tizanidine prn. Xray showed no acute concerns. Seek care if pain worsens. He is heading out of town but will contact office upon return if he is not improved.

## 2016-04-25 NOTE — Progress Notes (Signed)
Patient ID: Marc Jenkins, male   DOB: 1965-10-06, 50 y.o.   MRN: [id number]   Subjective:    Patient ID: Marc Jenkins, male    DOB: January 25, 1966, 50 y.o.   MRN: [id number]  Chief Complaint  Patient presents with  . Back Pain    3 weeks ago moving furniture, then notice back pain, seen chiro, and the pain keep getting worse. Pain scale 10    HPI Patient is in today for evaluation of low back pain with radicular pain down LLE. He was lifting heavy furniture over a week ago and had sudden onset low back pain with rest, heat and Aleve/Tylenol combo he improved. It recurred and he underwent chiropractic adjustments and PT and it improved again unfortunately now it is severe and he has to travel out of town. Can hit a 10 of 10 and he has pain radiating down LLE. No icontinence. Denies CP/palp/SOB/HA/congestion/fevers/GI or GU c/o. Taking meds as prescribed  Past Medical History:  Diagnosis Date  . Allergy   . Chicken pox as a child  . Depression   . FH: colon cancer 05/04/2012  . H/O measles   . H/O mumps   . Headache, cluster   . Hyperlipidemia 05/05/2011  . Left hand pain 05/04/2012  . Measles as a child  . Mumps as a child  . Muscle cramp 11/12/2014  . Preventative health care 05/05/2011    Past Surgical History:  Procedure Laterality Date  . ACHILLES TENDON SURGERY  2-11  . TONSILLECTOMY AND ADENOIDECTOMY      Family History  Problem Relation Age of Onset  . Ulcers Mother     in colon  . Hyperlipidemia Mother   . Colon cancer Father 73  . Hyperlipidemia Sister   . Diabetes Sister     type 2/type 2  . Obesity Sister     Social History   Social History  . Marital status: Married    Spouse name: N/A  . Number of children: N/A  . Years of education: N/A   Occupational History  . Not on file.   Social History Main Topics  . Smoking status: Never Smoker  . Smokeless tobacco: Never Used  . Alcohol use Yes     Comment: 2 glass of wine or a beer nightly  .  Drug use: No  . Sexual activity: Yes    Partners: Female   Other Topics Concern  . Not on file   Social History Narrative  . No narrative on file    Outpatient Medications Prior to Visit  Medication Sig Dispense Refill  . Cholecalciferol (VITAMIN D3) 5000 UNITS TABS Take by mouth.    . COD LIVER OIL PO Take by mouth.    . Multiple Vitamin (MULTIVITAMIN) capsule Take 1 capsule by mouth daily.    . NON FORMULARY MTHR=B 6- 2 daily    . OVER THE COUNTER MEDICATION Chloroplex- 2 daily    . pyridOXINE (VITAMIN B-6) 100 MG tablet Take 100 mg by mouth daily.     No facility-administered medications prior to visit.     No Known Allergies  Review of Systems  Constitutional: Negative for chills and fever.  Respiratory: Negative for shortness of breath.   Cardiovascular: Negative for chest pain.  Gastrointestinal: Negative for blood in stool, diarrhea and melena.  Genitourinary: Negative for dysuria, flank pain, frequency and urgency.  Musculoskeletal: Positive for back pain and joint pain.  Neurological: Positive for tingling. Negative for headaches.  Objective:    Physical Exam  Constitutional: He is oriented to person, place, and time. He appears well-developed and well-nourished. No distress.  HENT:  Head: Normocephalic and atraumatic.  Nose: Nose normal.  Eyes: Right eye exhibits no discharge. Left eye exhibits no discharge.  Neck: Normal range of motion. Neck supple.  Cardiovascular: Normal rate and regular rhythm.   No murmur heard. Pulmonary/Chest: Effort normal and breath sounds normal.  Abdominal: Soft. Bowel sounds are normal. He exhibits no distension. There is no tenderness.  Musculoskeletal: He exhibits no edema.  Neurological: He is alert and oriented to person, place, and time. Coordination normal.  5/5 strength RLE, 4/5 strength LLE  Skin: Skin is warm and dry.  Psychiatric: He has a normal mood and affect.  Nursing note and vitals reviewed.   BP  122/82 (BP Location: Right Arm, Patient Position: Sitting, Cuff Size: Normal)   Pulse 66   Temp 97.9 F (36.6 C) (Oral)   Wt 167 lb 6.4 oz (75.9 kg)   SpO2 96%   BMI 24.72 kg/m  Wt Readings from Last 3 Encounters:  04/24/16 167 lb 6.4 oz (75.9 kg)  01/17/16 167 lb 2 oz (75.8 kg)  10/04/15 168 lb 2 oz (76.3 kg)     Lab Results  Component Value Date   WBC 4.8 01/13/2016   HGB 13.1 01/13/2016   HCT 38.8 (L) 01/13/2016   PLT 247.0 01/13/2016   GLUCOSE 85 01/13/2016   CHOL 236 (H) 01/13/2016   TRIG 132.0 01/13/2016   HDL 60.10 01/13/2016   LDLDIRECT 201.4 05/04/2012   LDLCALC 149 (H) 01/13/2016   ALT 17 01/13/2016   AST 20 01/13/2016   NA 138 01/13/2016   K 4.3 01/13/2016   CL 104 01/13/2016   CREATININE 1.06 01/13/2016   BUN 19 01/13/2016   CO2 27 01/13/2016   TSH 1.76 01/13/2016   PSA 1.36 11/12/2014    Lab Results  Component Value Date   TSH 1.76 01/13/2016   Lab Results  Component Value Date   WBC 4.8 01/13/2016   HGB 13.1 01/13/2016   HCT 38.8 (L) 01/13/2016   MCV 90.9 01/13/2016   PLT 247.0 01/13/2016   Lab Results  Component Value Date   NA 138 01/13/2016   K 4.3 01/13/2016   CO2 27 01/13/2016   GLUCOSE 85 01/13/2016   BUN 19 01/13/2016   CREATININE 1.06 01/13/2016   BILITOT 0.5 01/13/2016   ALKPHOS 38 (L) 01/13/2016   AST 20 01/13/2016   ALT 17 01/13/2016   PROT 7.0 01/13/2016   ALBUMIN 4.4 01/13/2016   CALCIUM 9.4 01/13/2016   GFR 78.19 01/13/2016   Lab Results  Component Value Date   CHOL 236 (H) 01/13/2016   Lab Results  Component Value Date   HDL 60.10 01/13/2016   Lab Results  Component Value Date   LDLCALC 149 (H) 01/13/2016   Lab Results  Component Value Date   TRIG 132.0 01/13/2016   Lab Results  Component Value Date   CHOLHDL 4 01/13/2016   No results found for: HGBA1C     Assessment & Plan:   Problem List Items Addressed This Visit    Acute left-sided low back pain with sciatica - Primary    Encouraged to  alternate moist heat and ice, stay as active as tolerated. Given a Depo Medrol shot in office and started on Medrol dosepak the next day. Use Lidocaine patches prn, Tylenol prn and Tizanidine prn. Xray showed no acute concerns. Seek  care if pain worsens. He is heading out of town but will contact office upon return if he is not improved.       Relevant Medications   tiZANidine (ZANAFLEX) 4 MG tablet   methylPREDNISolone (MEDROL) 4 MG tablet   methylPREDNISolone acetate (DEPO-MEDROL) injection 40 mg (Completed)   Other Relevant Orders   DG Lumbar Spine Complete (Completed)    Other Visit Diagnoses   None.     I am having Mr. Domer start on tiZANidine and methylPREDNISolone. I am also having him maintain his COD LIVER OIL PO, multivitamin, OVER THE COUNTER MEDICATION, NON FORMULARY, Vitamin D3, and pyridOXINE. We administered methylPREDNISolone acetate.  Meds ordered this encounter  Medications  . tiZANidine (ZANAFLEX) 4 MG tablet    Sig: Take 1 tablet (4 mg total) by mouth every 8 (eight) hours as needed for muscle spasms.    Dispense:  40 tablet    Refill:  1  . methylPREDNISolone (MEDROL) 4 MG tablet    Sig: 6 tabs po x 1 d then 5 tabs pox 1 d then 4 tabs po x 1 day then 3 tabs po x 1 d then 2 tabs po x 1 day then 1 tabs po x 1 day    Dispense:  21 tablet    Refill:  0  . methylPREDNISolone acetate (DEPO-MEDROL) injection 40 mg     Penni Homans, MD

## 2016-04-26 ENCOUNTER — Encounter: Payer: Self-pay | Admitting: Family Medicine

## 2016-04-27 ENCOUNTER — Encounter: Payer: Self-pay | Admitting: Family Medicine

## 2016-05-03 ENCOUNTER — Encounter: Payer: Self-pay | Admitting: Family Medicine

## 2016-05-04 ENCOUNTER — Encounter: Payer: Self-pay | Admitting: Family Medicine

## 2016-05-04 DIAGNOSIS — M5416 Radiculopathy, lumbar region: Secondary | ICD-10-CM

## 2016-05-07 NOTE — Addendum Note (Signed)
Addended by: Lamar Blinks C on: 05/07/2016 08:59 AM   Modules accepted: Orders

## 2016-05-08 ENCOUNTER — Encounter: Payer: Self-pay | Admitting: Family Medicine

## 2016-05-09 ENCOUNTER — Ambulatory Visit (HOSPITAL_BASED_OUTPATIENT_CLINIC_OR_DEPARTMENT_OTHER)
Admission: RE | Admit: 2016-05-09 | Discharge: 2016-05-09 | Disposition: A | Discharge status: Home / Self Care | Payer: BLUE CROSS/BLUE SHIELD | Source: Ambulatory Visit | Attending: Family Medicine | Admitting: Family Medicine

## 2016-05-09 DIAGNOSIS — M5442 Lumbago with sciatica, left side: Secondary | ICD-10-CM | POA: Insufficient documentation

## 2016-05-09 DIAGNOSIS — M545 Low back pain: Secondary | ICD-10-CM | POA: Diagnosis not present

## 2016-05-09 DIAGNOSIS — M5126 Other intervertebral disc displacement, lumbar region: Secondary | ICD-10-CM | POA: Diagnosis not present

## 2016-05-09 DIAGNOSIS — M5416 Radiculopathy, lumbar region: Secondary | ICD-10-CM

## 2016-05-11 ENCOUNTER — Other Ambulatory Visit (INDEPENDENT_AMBULATORY_CARE_PROVIDER_SITE_OTHER): Payer: BLUE CROSS/BLUE SHIELD

## 2016-05-11 ENCOUNTER — Encounter: Payer: Self-pay | Admitting: Family Medicine

## 2016-05-11 DIAGNOSIS — D509 Iron deficiency anemia, unspecified: Secondary | ICD-10-CM

## 2016-05-11 DIAGNOSIS — E785 Hyperlipidemia, unspecified: Secondary | ICD-10-CM

## 2016-05-11 DIAGNOSIS — Z831 Family history of other infectious and parasitic diseases: Secondary | ICD-10-CM | POA: Diagnosis not present

## 2016-05-11 DIAGNOSIS — D649 Anemia, unspecified: Secondary | ICD-10-CM

## 2016-05-11 DIAGNOSIS — R748 Abnormal levels of other serum enzymes: Secondary | ICD-10-CM | POA: Diagnosis not present

## 2016-05-11 LAB — COMPREHENSIVE METABOLIC PANEL
ALT: 19 U/L (ref 0–53)
AST: 19 U/L (ref 0–37)
Albumin: 4.3 g/dL (ref 3.5–5.2)
Alkaline Phosphatase: 39 U/L (ref 39–117)
BILIRUBIN TOTAL: 0.3 mg/dL (ref 0.2–1.2)
BUN: 17 mg/dL (ref 6–23)
CO2: 26 meq/L (ref 19–32)
Calcium: 9.2 mg/dL (ref 8.4–10.5)
Chloride: 106 mEq/L (ref 96–112)
Creatinine, Ser: 0.95 mg/dL (ref 0.40–1.50)
GFR: 88.61 mL/min (ref >60.00)
GLUCOSE: 91 mg/dL (ref 70–99)
Potassium: 4 mEq/L (ref 3.5–5.1)
Sodium: 140 mEq/L (ref 135–145)
Total Protein: 6.8 g/dL (ref 6.0–8.3)

## 2016-05-11 LAB — CBC WITH DIFFERENTIAL/PLATELET
BASOS ABS: 0 10*3/uL (ref 0.0–0.1)
Basophils Relative: 0.3 % (ref 0.0–3.0)
EOS ABS: 0.1 10*3/uL (ref 0.0–0.7)
Eosinophils Relative: 1.4 % (ref 0.0–5.0)
HCT: 37.3 % — ABNORMAL LOW (ref 39.0–52.0)
Hemoglobin: 12.6 g/dL — ABNORMAL LOW (ref 13.0–17.0)
LYMPHS ABS: 2.2 10*3/uL (ref 0.7–4.0)
Lymphocytes Relative: 46.1 % — ABNORMAL HIGH (ref 12.0–46.0)
MCHC: 33.9 g/dL (ref 30.0–36.0)
MCV: 92.2 fl (ref 78.0–100.0)
Monocytes Absolute: 0.6 10*3/uL (ref 0.1–1.0)
Monocytes Relative: 12.5 % — ABNORMAL HIGH (ref 3.0–12.0)
NEUTROS ABS: 1.9 10*3/uL (ref 1.4–7.7)
NEUTROS PCT: 39.7 % — AB (ref 43.0–77.0)
PLATELETS: 236 10*3/uL (ref 150.0–400.0)
RBC: 4.05 Mil/uL — ABNORMAL LOW (ref 4.22–5.81)
RDW: 14.1 % (ref 11.5–15.5)
WBC: 4.8 10*3/uL (ref 4.0–10.5)

## 2016-05-11 LAB — LIPID PANEL
CHOL/HDL RATIO: 4
Cholesterol: 223 mg/dL — ABNORMAL HIGH (ref 0–200)
HDL: 52.7 mg/dL (ref >39.00)
LDL CALC: 147 mg/dL — AB (ref 0–99)
NONHDL: 169.99
Triglycerides: 117 mg/dL (ref 0.0–149.0)
VLDL: 23.4 mg/dL (ref 0.0–40.0)

## 2016-05-11 LAB — RETICULOCYTES
ABS Retic: 20350 cells/uL — ABNORMAL LOW (ref 25000–90000)
RBC.: 4.07 MIL/uL — AB (ref 4.20–5.80)
RETIC CT PCT: 0.5 %

## 2016-05-12 ENCOUNTER — Other Ambulatory Visit: Payer: Self-pay | Admitting: Family Medicine

## 2016-05-12 DIAGNOSIS — D649 Anemia, unspecified: Secondary | ICD-10-CM

## 2016-05-13 DIAGNOSIS — M9905 Segmental and somatic dysfunction of pelvic region: Secondary | ICD-10-CM | POA: Diagnosis not present

## 2016-05-13 DIAGNOSIS — M791 Myalgia: Secondary | ICD-10-CM | POA: Diagnosis not present

## 2016-05-13 DIAGNOSIS — M9903 Segmental and somatic dysfunction of lumbar region: Secondary | ICD-10-CM | POA: Diagnosis not present

## 2016-05-13 DIAGNOSIS — M9902 Segmental and somatic dysfunction of thoracic region: Secondary | ICD-10-CM | POA: Diagnosis not present

## 2016-05-16 LAB — MYCOPLASMA PNEUMONIAE AB, IGM/IGG
M. PNEUMONIAE AB, IGG: 0.93 — AB (ref <=0.90)
Mycoplasma pneumo IgM: 40 U/mL (ref <770)

## 2016-05-17 ENCOUNTER — Encounter: Payer: Self-pay | Admitting: Family Medicine

## 2016-05-18 ENCOUNTER — Other Ambulatory Visit: Payer: Self-pay | Admitting: Family Medicine

## 2016-05-18 ENCOUNTER — Telehealth: Payer: Self-pay | Admitting: Family Medicine

## 2016-05-18 DIAGNOSIS — M9902 Segmental and somatic dysfunction of thoracic region: Secondary | ICD-10-CM | POA: Diagnosis not present

## 2016-05-18 DIAGNOSIS — R718 Other abnormality of red blood cells: Secondary | ICD-10-CM

## 2016-05-18 DIAGNOSIS — M791 Myalgia: Secondary | ICD-10-CM | POA: Diagnosis not present

## 2016-05-18 DIAGNOSIS — M9903 Segmental and somatic dysfunction of lumbar region: Secondary | ICD-10-CM | POA: Diagnosis not present

## 2016-05-18 DIAGNOSIS — M9905 Segmental and somatic dysfunction of pelvic region: Secondary | ICD-10-CM | POA: Diagnosis not present

## 2016-05-18 MED ORDER — AZITHROMYCIN 250 MG PO TABS: ORAL_TABLET | ORAL | 0 refills | Status: DC

## 2016-05-18 NOTE — Telephone Encounter (Signed)
Patient informed and put order in for lab to be done in one month at the [facility].

## 2016-05-18 NOTE — Telephone Encounter (Signed)
-----   Message from Mosie Lukes, MD sent at 05/17/2016  4:04 PM EDT ----- Please order him a CBC with diff and retic count in 3-4 weeks for low RBC's.

## 2016-05-21 DIAGNOSIS — M9903 Segmental and somatic dysfunction of lumbar region: Secondary | ICD-10-CM | POA: Diagnosis not present

## 2016-05-21 DIAGNOSIS — M9905 Segmental and somatic dysfunction of pelvic region: Secondary | ICD-10-CM | POA: Diagnosis not present

## 2016-05-21 DIAGNOSIS — M5116 Intervertebral disc disorders with radiculopathy, lumbar region: Secondary | ICD-10-CM | POA: Diagnosis not present

## 2016-05-21 DIAGNOSIS — M791 Myalgia: Secondary | ICD-10-CM | POA: Diagnosis not present

## 2016-05-25 DIAGNOSIS — M9905 Segmental and somatic dysfunction of pelvic region: Secondary | ICD-10-CM | POA: Diagnosis not present

## 2016-05-25 DIAGNOSIS — M9903 Segmental and somatic dysfunction of lumbar region: Secondary | ICD-10-CM | POA: Diagnosis not present

## 2016-05-25 DIAGNOSIS — M791 Myalgia: Secondary | ICD-10-CM | POA: Diagnosis not present

## 2016-05-25 DIAGNOSIS — M5116 Intervertebral disc disorders with radiculopathy, lumbar region: Secondary | ICD-10-CM | POA: Diagnosis not present

## 2016-05-29 DIAGNOSIS — M9903 Segmental and somatic dysfunction of lumbar region: Secondary | ICD-10-CM | POA: Diagnosis not present

## 2016-05-29 DIAGNOSIS — M9905 Segmental and somatic dysfunction of pelvic region: Secondary | ICD-10-CM | POA: Diagnosis not present

## 2016-05-29 DIAGNOSIS — M5116 Intervertebral disc disorders with radiculopathy, lumbar region: Secondary | ICD-10-CM | POA: Diagnosis not present

## 2016-05-29 DIAGNOSIS — M791 Myalgia: Secondary | ICD-10-CM | POA: Diagnosis not present

## 2016-06-03 DIAGNOSIS — M791 Myalgia: Secondary | ICD-10-CM | POA: Diagnosis not present

## 2016-06-03 DIAGNOSIS — M9903 Segmental and somatic dysfunction of lumbar region: Secondary | ICD-10-CM | POA: Diagnosis not present

## 2016-06-03 DIAGNOSIS — M5116 Intervertebral disc disorders with radiculopathy, lumbar region: Secondary | ICD-10-CM | POA: Diagnosis not present

## 2016-06-03 DIAGNOSIS — M9905 Segmental and somatic dysfunction of pelvic region: Secondary | ICD-10-CM | POA: Diagnosis not present

## 2016-06-09 DIAGNOSIS — M791 Myalgia: Secondary | ICD-10-CM | POA: Diagnosis not present

## 2016-06-09 DIAGNOSIS — M9903 Segmental and somatic dysfunction of lumbar region: Secondary | ICD-10-CM | POA: Diagnosis not present

## 2016-06-09 DIAGNOSIS — M5116 Intervertebral disc disorders with radiculopathy, lumbar region: Secondary | ICD-10-CM | POA: Diagnosis not present

## 2016-06-09 DIAGNOSIS — M9905 Segmental and somatic dysfunction of pelvic region: Secondary | ICD-10-CM | POA: Diagnosis not present

## 2016-06-15 ENCOUNTER — Encounter: Payer: Self-pay | Admitting: Family Medicine

## 2016-06-15 ENCOUNTER — Other Ambulatory Visit (INDEPENDENT_AMBULATORY_CARE_PROVIDER_SITE_OTHER): Payer: BLUE CROSS/BLUE SHIELD

## 2016-06-15 DIAGNOSIS — R718 Other abnormality of red blood cells: Secondary | ICD-10-CM

## 2016-06-15 LAB — CBC WITH DIFFERENTIAL/PLATELET
BASOS ABS: 0 10*3/uL (ref 0.0–0.1)
Basophils Relative: 0.4 % (ref 0.0–3.0)
EOS ABS: 0.1 10*3/uL (ref 0.0–0.7)
Eosinophils Relative: 1.7 % (ref 0.0–5.0)
HCT: 40.6 % (ref 39.0–52.0)
Hemoglobin: 13.6 g/dL (ref 13.0–17.0)
LYMPHS ABS: 2.3 10*3/uL (ref 0.7–4.0)
Lymphocytes Relative: 47.6 % — ABNORMAL HIGH (ref 12.0–46.0)
MCHC: 33.6 g/dL (ref 30.0–36.0)
MCV: 91.9 fl (ref 78.0–100.0)
MONO ABS: 0.6 10*3/uL (ref 0.1–1.0)
MONOS PCT: 12.5 % — AB (ref 3.0–12.0)
NEUTROS PCT: 37.8 % — AB (ref 43.0–77.0)
Neutro Abs: 1.9 10*3/uL (ref 1.4–7.7)
Platelets: 238 10*3/uL (ref 150.0–400.0)
RBC: 4.41 Mil/uL (ref 4.22–5.81)
RDW: 13.9 % (ref 11.5–15.5)
WBC: 4.9 10*3/uL (ref 4.0–10.5)

## 2016-06-15 LAB — RETICULOCYTES
ABS RETIC: 38970 {cells}/uL (ref 25000–90000)
RBC.: 4.33 MIL/uL (ref 4.20–5.80)
RETIC CT PCT: 0.9 %

## 2016-06-18 DIAGNOSIS — M545 Low back pain: Secondary | ICD-10-CM | POA: Diagnosis not present

## 2016-06-23 DIAGNOSIS — M545 Low back pain: Secondary | ICD-10-CM | POA: Diagnosis not present

## 2016-06-26 DIAGNOSIS — M545 Low back pain: Secondary | ICD-10-CM | POA: Diagnosis not present

## 2016-06-30 DIAGNOSIS — M545 Low back pain: Secondary | ICD-10-CM | POA: Diagnosis not present

## 2016-07-03 DIAGNOSIS — M545 Low back pain: Secondary | ICD-10-CM | POA: Diagnosis not present

## 2016-07-07 DIAGNOSIS — M545 Low back pain: Secondary | ICD-10-CM | POA: Diagnosis not present

## 2016-07-09 DIAGNOSIS — M545 Low back pain: Secondary | ICD-10-CM | POA: Diagnosis not present

## 2016-07-17 DIAGNOSIS — M545 Low back pain: Secondary | ICD-10-CM | POA: Diagnosis not present

## 2016-07-22 DIAGNOSIS — M545 Low back pain: Secondary | ICD-10-CM | POA: Diagnosis not present

## 2016-07-24 DIAGNOSIS — M545 Low back pain: Secondary | ICD-10-CM | POA: Diagnosis not present

## 2016-07-27 DIAGNOSIS — M545 Low back pain: Secondary | ICD-10-CM | POA: Diagnosis not present

## 2016-07-30 DIAGNOSIS — M545 Low back pain: Secondary | ICD-10-CM | POA: Diagnosis not present

## 2016-08-04 DIAGNOSIS — M545 Low back pain: Secondary | ICD-10-CM | POA: Diagnosis not present

## 2016-08-07 DIAGNOSIS — M5416 Radiculopathy, lumbar region: Secondary | ICD-10-CM | POA: Diagnosis not present

## 2016-08-07 DIAGNOSIS — M62552 Muscle wasting and atrophy, not elsewhere classified, left thigh: Secondary | ICD-10-CM | POA: Diagnosis not present

## 2016-08-07 DIAGNOSIS — M545 Low back pain: Secondary | ICD-10-CM | POA: Diagnosis not present

## 2016-08-11 DIAGNOSIS — M5416 Radiculopathy, lumbar region: Secondary | ICD-10-CM | POA: Diagnosis not present

## 2016-08-11 DIAGNOSIS — M545 Low back pain: Secondary | ICD-10-CM | POA: Diagnosis not present

## 2016-08-11 DIAGNOSIS — M62552 Muscle wasting and atrophy, not elsewhere classified, left thigh: Secondary | ICD-10-CM | POA: Diagnosis not present

## 2016-08-14 DIAGNOSIS — M5416 Radiculopathy, lumbar region: Secondary | ICD-10-CM | POA: Diagnosis not present

## 2016-08-14 DIAGNOSIS — M62552 Muscle wasting and atrophy, not elsewhere classified, left thigh: Secondary | ICD-10-CM | POA: Diagnosis not present

## 2016-08-14 DIAGNOSIS — M545 Low back pain: Secondary | ICD-10-CM | POA: Diagnosis not present

## 2016-08-18 DIAGNOSIS — M545 Low back pain: Secondary | ICD-10-CM | POA: Diagnosis not present

## 2016-08-18 DIAGNOSIS — M62552 Muscle wasting and atrophy, not elsewhere classified, left thigh: Secondary | ICD-10-CM | POA: Diagnosis not present

## 2016-08-18 DIAGNOSIS — M5416 Radiculopathy, lumbar region: Secondary | ICD-10-CM | POA: Diagnosis not present

## 2016-08-21 DIAGNOSIS — M62552 Muscle wasting and atrophy, not elsewhere classified, left thigh: Secondary | ICD-10-CM | POA: Diagnosis not present

## 2016-08-21 DIAGNOSIS — M5416 Radiculopathy, lumbar region: Secondary | ICD-10-CM | POA: Diagnosis not present

## 2016-08-21 DIAGNOSIS — M545 Low back pain: Secondary | ICD-10-CM | POA: Diagnosis not present

## 2016-08-25 DIAGNOSIS — M5416 Radiculopathy, lumbar region: Secondary | ICD-10-CM | POA: Diagnosis not present

## 2016-08-25 DIAGNOSIS — M545 Low back pain: Secondary | ICD-10-CM | POA: Diagnosis not present

## 2016-08-25 DIAGNOSIS — M62552 Muscle wasting and atrophy, not elsewhere classified, left thigh: Secondary | ICD-10-CM | POA: Diagnosis not present

## 2016-08-27 DIAGNOSIS — M545 Low back pain: Secondary | ICD-10-CM | POA: Diagnosis not present

## 2016-08-27 DIAGNOSIS — M5416 Radiculopathy, lumbar region: Secondary | ICD-10-CM | POA: Diagnosis not present

## 2016-08-27 DIAGNOSIS — M62552 Muscle wasting and atrophy, not elsewhere classified, left thigh: Secondary | ICD-10-CM | POA: Diagnosis not present

## 2016-09-01 DIAGNOSIS — M5416 Radiculopathy, lumbar region: Secondary | ICD-10-CM | POA: Diagnosis not present

## 2016-09-01 DIAGNOSIS — M545 Low back pain: Secondary | ICD-10-CM | POA: Diagnosis not present

## 2016-09-01 DIAGNOSIS — M62552 Muscle wasting and atrophy, not elsewhere classified, left thigh: Secondary | ICD-10-CM | POA: Diagnosis not present

## 2016-09-04 DIAGNOSIS — M5416 Radiculopathy, lumbar region: Secondary | ICD-10-CM | POA: Diagnosis not present

## 2016-09-04 DIAGNOSIS — M62552 Muscle wasting and atrophy, not elsewhere classified, left thigh: Secondary | ICD-10-CM | POA: Diagnosis not present

## 2016-09-04 DIAGNOSIS — M545 Low back pain: Secondary | ICD-10-CM | POA: Diagnosis not present

## 2016-09-15 DIAGNOSIS — M5416 Radiculopathy, lumbar region: Secondary | ICD-10-CM | POA: Diagnosis not present

## 2016-09-15 DIAGNOSIS — M545 Low back pain: Secondary | ICD-10-CM | POA: Diagnosis not present

## 2016-09-15 DIAGNOSIS — M62552 Muscle wasting and atrophy, not elsewhere classified, left thigh: Secondary | ICD-10-CM | POA: Diagnosis not present

## 2016-09-18 DIAGNOSIS — M62552 Muscle wasting and atrophy, not elsewhere classified, left thigh: Secondary | ICD-10-CM | POA: Diagnosis not present

## 2016-09-18 DIAGNOSIS — M5416 Radiculopathy, lumbar region: Secondary | ICD-10-CM | POA: Diagnosis not present

## 2016-09-18 DIAGNOSIS — M25521 Pain in right elbow: Secondary | ICD-10-CM | POA: Diagnosis not present

## 2016-09-18 DIAGNOSIS — M545 Low back pain: Secondary | ICD-10-CM | POA: Diagnosis not present

## 2016-09-22 DIAGNOSIS — M545 Low back pain: Secondary | ICD-10-CM | POA: Diagnosis not present

## 2016-09-22 DIAGNOSIS — M25521 Pain in right elbow: Secondary | ICD-10-CM | POA: Diagnosis not present

## 2016-09-22 DIAGNOSIS — M5416 Radiculopathy, lumbar region: Secondary | ICD-10-CM | POA: Diagnosis not present

## 2016-09-22 DIAGNOSIS — M62552 Muscle wasting and atrophy, not elsewhere classified, left thigh: Secondary | ICD-10-CM | POA: Diagnosis not present

## 2016-09-25 DIAGNOSIS — M25521 Pain in right elbow: Secondary | ICD-10-CM | POA: Diagnosis not present

## 2016-09-25 DIAGNOSIS — M5416 Radiculopathy, lumbar region: Secondary | ICD-10-CM | POA: Diagnosis not present

## 2016-09-25 DIAGNOSIS — M62552 Muscle wasting and atrophy, not elsewhere classified, left thigh: Secondary | ICD-10-CM | POA: Diagnosis not present

## 2016-09-25 DIAGNOSIS — M545 Low back pain: Secondary | ICD-10-CM | POA: Diagnosis not present

## 2016-09-29 DIAGNOSIS — M545 Low back pain: Secondary | ICD-10-CM | POA: Diagnosis not present

## 2016-09-29 DIAGNOSIS — M62552 Muscle wasting and atrophy, not elsewhere classified, left thigh: Secondary | ICD-10-CM | POA: Diagnosis not present

## 2016-09-29 DIAGNOSIS — M5416 Radiculopathy, lumbar region: Secondary | ICD-10-CM | POA: Diagnosis not present

## 2016-09-29 DIAGNOSIS — M25521 Pain in right elbow: Secondary | ICD-10-CM | POA: Diagnosis not present

## 2016-10-02 DIAGNOSIS — M5416 Radiculopathy, lumbar region: Secondary | ICD-10-CM | POA: Diagnosis not present

## 2016-10-02 DIAGNOSIS — M545 Low back pain: Secondary | ICD-10-CM | POA: Diagnosis not present

## 2016-10-02 DIAGNOSIS — M62552 Muscle wasting and atrophy, not elsewhere classified, left thigh: Secondary | ICD-10-CM | POA: Diagnosis not present

## 2016-10-02 DIAGNOSIS — M25521 Pain in right elbow: Secondary | ICD-10-CM | POA: Diagnosis not present

## 2016-10-06 DIAGNOSIS — M25521 Pain in right elbow: Secondary | ICD-10-CM | POA: Diagnosis not present

## 2016-10-06 DIAGNOSIS — M62552 Muscle wasting and atrophy, not elsewhere classified, left thigh: Secondary | ICD-10-CM | POA: Diagnosis not present

## 2016-10-06 DIAGNOSIS — M545 Low back pain: Secondary | ICD-10-CM | POA: Diagnosis not present

## 2016-10-06 DIAGNOSIS — M5416 Radiculopathy, lumbar region: Secondary | ICD-10-CM | POA: Diagnosis not present

## 2016-10-08 DIAGNOSIS — M25521 Pain in right elbow: Secondary | ICD-10-CM | POA: Diagnosis not present

## 2016-10-08 DIAGNOSIS — M545 Low back pain: Secondary | ICD-10-CM | POA: Diagnosis not present

## 2016-10-08 DIAGNOSIS — M5416 Radiculopathy, lumbar region: Secondary | ICD-10-CM | POA: Diagnosis not present

## 2016-10-08 DIAGNOSIS — M62552 Muscle wasting and atrophy, not elsewhere classified, left thigh: Secondary | ICD-10-CM | POA: Diagnosis not present

## 2016-10-13 DIAGNOSIS — M545 Low back pain: Secondary | ICD-10-CM | POA: Diagnosis not present

## 2016-10-13 DIAGNOSIS — M25521 Pain in right elbow: Secondary | ICD-10-CM | POA: Diagnosis not present

## 2016-10-13 DIAGNOSIS — M62552 Muscle wasting and atrophy, not elsewhere classified, left thigh: Secondary | ICD-10-CM | POA: Diagnosis not present

## 2016-10-13 DIAGNOSIS — M5416 Radiculopathy, lumbar region: Secondary | ICD-10-CM | POA: Diagnosis not present

## 2016-11-05 DIAGNOSIS — M545 Low back pain: Secondary | ICD-10-CM | POA: Diagnosis not present

## 2016-11-05 DIAGNOSIS — M5416 Radiculopathy, lumbar region: Secondary | ICD-10-CM | POA: Diagnosis not present

## 2016-11-05 DIAGNOSIS — M25521 Pain in right elbow: Secondary | ICD-10-CM | POA: Diagnosis not present

## 2016-11-05 DIAGNOSIS — M62552 Muscle wasting and atrophy, not elsewhere classified, left thigh: Secondary | ICD-10-CM | POA: Diagnosis not present

## 2016-11-19 DIAGNOSIS — M545 Low back pain: Secondary | ICD-10-CM | POA: Diagnosis not present

## 2016-11-19 DIAGNOSIS — M5416 Radiculopathy, lumbar region: Secondary | ICD-10-CM | POA: Diagnosis not present

## 2016-11-19 DIAGNOSIS — M62552 Muscle wasting and atrophy, not elsewhere classified, left thigh: Secondary | ICD-10-CM | POA: Diagnosis not present

## 2016-11-19 DIAGNOSIS — M25521 Pain in right elbow: Secondary | ICD-10-CM | POA: Diagnosis not present

## 2016-11-24 DIAGNOSIS — M25521 Pain in right elbow: Secondary | ICD-10-CM | POA: Diagnosis not present

## 2016-11-24 DIAGNOSIS — M545 Low back pain: Secondary | ICD-10-CM | POA: Diagnosis not present

## 2016-11-24 DIAGNOSIS — M5416 Radiculopathy, lumbar region: Secondary | ICD-10-CM | POA: Diagnosis not present

## 2016-11-24 DIAGNOSIS — M62552 Muscle wasting and atrophy, not elsewhere classified, left thigh: Secondary | ICD-10-CM | POA: Diagnosis not present

## 2016-11-26 DIAGNOSIS — M545 Low back pain: Secondary | ICD-10-CM | POA: Diagnosis not present

## 2016-11-26 DIAGNOSIS — M25521 Pain in right elbow: Secondary | ICD-10-CM | POA: Diagnosis not present

## 2016-11-26 DIAGNOSIS — M62552 Muscle wasting and atrophy, not elsewhere classified, left thigh: Secondary | ICD-10-CM | POA: Diagnosis not present

## 2016-11-26 DIAGNOSIS — M5416 Radiculopathy, lumbar region: Secondary | ICD-10-CM | POA: Diagnosis not present

## 2016-12-01 DIAGNOSIS — M5416 Radiculopathy, lumbar region: Secondary | ICD-10-CM | POA: Diagnosis not present

## 2016-12-01 DIAGNOSIS — M62552 Muscle wasting and atrophy, not elsewhere classified, left thigh: Secondary | ICD-10-CM | POA: Diagnosis not present

## 2016-12-01 DIAGNOSIS — M545 Low back pain: Secondary | ICD-10-CM | POA: Diagnosis not present

## 2016-12-01 DIAGNOSIS — M25521 Pain in right elbow: Secondary | ICD-10-CM | POA: Diagnosis not present

## 2016-12-03 DIAGNOSIS — M545 Low back pain: Secondary | ICD-10-CM | POA: Diagnosis not present

## 2016-12-03 DIAGNOSIS — M25521 Pain in right elbow: Secondary | ICD-10-CM | POA: Diagnosis not present

## 2016-12-03 DIAGNOSIS — M5416 Radiculopathy, lumbar region: Secondary | ICD-10-CM | POA: Diagnosis not present

## 2016-12-03 DIAGNOSIS — M62552 Muscle wasting and atrophy, not elsewhere classified, left thigh: Secondary | ICD-10-CM | POA: Diagnosis not present

## 2016-12-08 DIAGNOSIS — M5416 Radiculopathy, lumbar region: Secondary | ICD-10-CM | POA: Diagnosis not present

## 2016-12-08 DIAGNOSIS — M545 Low back pain: Secondary | ICD-10-CM | POA: Diagnosis not present

## 2016-12-08 DIAGNOSIS — M62552 Muscle wasting and atrophy, not elsewhere classified, left thigh: Secondary | ICD-10-CM | POA: Diagnosis not present

## 2016-12-08 DIAGNOSIS — M25521 Pain in right elbow: Secondary | ICD-10-CM | POA: Diagnosis not present

## 2016-12-11 DIAGNOSIS — M5416 Radiculopathy, lumbar region: Secondary | ICD-10-CM | POA: Diagnosis not present

## 2016-12-11 DIAGNOSIS — M62552 Muscle wasting and atrophy, not elsewhere classified, left thigh: Secondary | ICD-10-CM | POA: Diagnosis not present

## 2016-12-11 DIAGNOSIS — M25521 Pain in right elbow: Secondary | ICD-10-CM | POA: Diagnosis not present

## 2016-12-11 DIAGNOSIS — M545 Low back pain: Secondary | ICD-10-CM | POA: Diagnosis not present

## 2016-12-15 DIAGNOSIS — M5416 Radiculopathy, lumbar region: Secondary | ICD-10-CM | POA: Diagnosis not present

## 2016-12-15 DIAGNOSIS — M62552 Muscle wasting and atrophy, not elsewhere classified, left thigh: Secondary | ICD-10-CM | POA: Diagnosis not present

## 2016-12-15 DIAGNOSIS — M545 Low back pain: Secondary | ICD-10-CM | POA: Diagnosis not present

## 2016-12-15 DIAGNOSIS — M25521 Pain in right elbow: Secondary | ICD-10-CM | POA: Diagnosis not present

## 2016-12-15 IMAGING — MR MR LUMBAR SPINE W/O CM
9 series · 44 of 48 positions shown · non-contrast
Comparison: Lumbar spine radiographs April 24, 2016

CLINICAL DATA: Low back pain radiating to the LEFT lower extremity
with weakness, which began after doing heavy lifting 4 weeks ago.

EXAM:
MRI LUMBAR SPINE WITHOUT CONTRAST
TECHNIQUE: Multiplanar, multisequence MR imaging of the lumbar spine was
performed. No intravenous contrast was administered.

[Series 3: T1 · sagittal · 4.0mm · 0.81mm/px · 4 of 16 slices shown (1 of 5)]
[im 1/16]
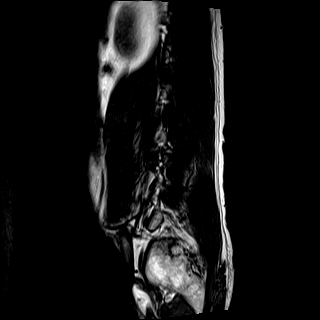
[im 6/16]
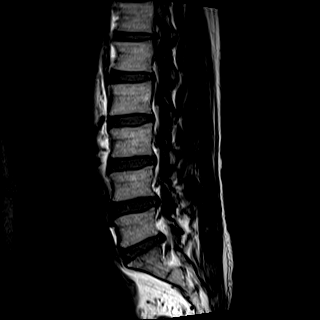
[im 11/16]
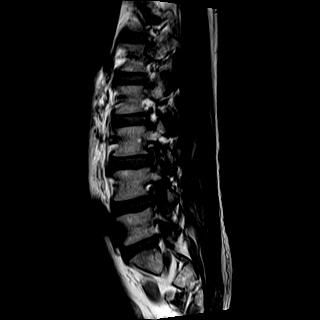
[im 16/16]
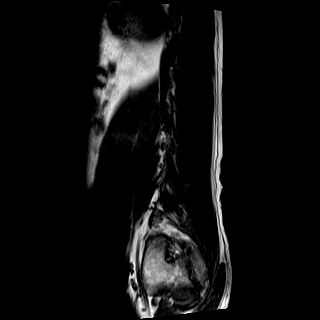

[Series 4: T2 · sagittal · 4.0mm · 0.81mm/px · 4 of 16 slices shown (1 of 3)]
[im 1/16]
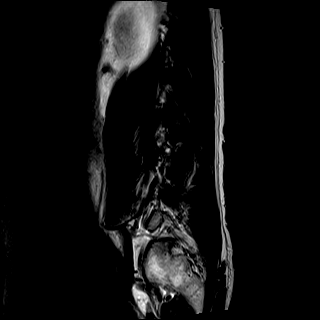
[im 6/16]
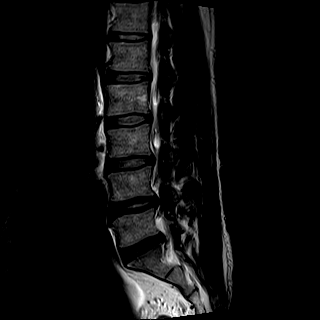
[im 11/16]
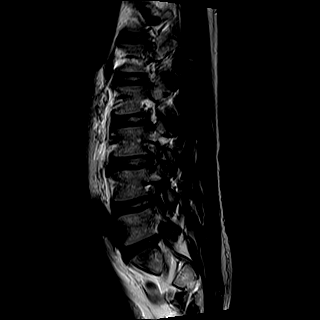
[im 16/16]
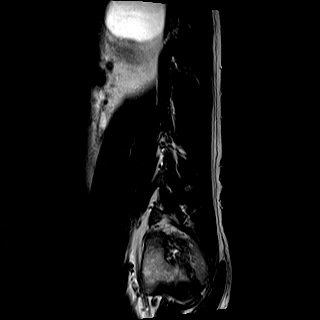

[Series 5: STIR · sagittal · 4.0mm · 0.81mm/px · 1 of 16 slices shown]
[im 1/16]
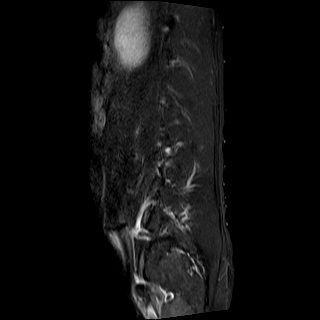

[Series 7: T1 · sagittal · 4.0mm · 0.81mm/px · 5 of 16 slices shown (2 of 5)]
[im 1/16]
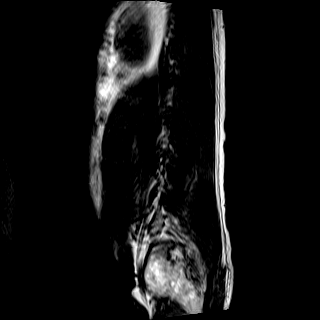
[im 4/16]
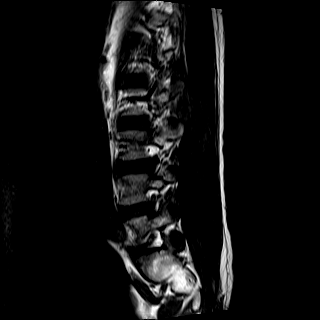
[im 8/16]
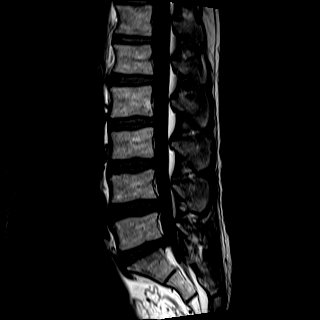
[im 12/16]
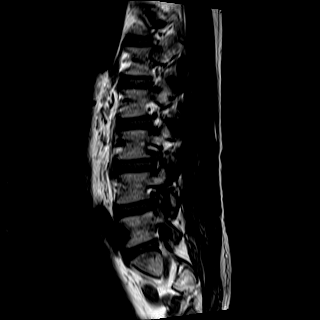
[im 16/16]
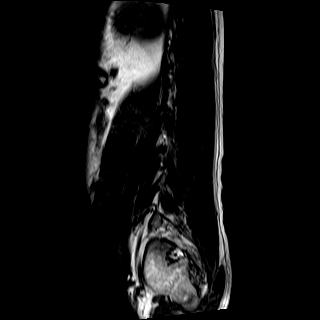

[Series 8: T2 · axial · 4.0mm · 0.62mm/px · z∈[+29,+124]mm · 6 of 20 slices shown (2 of 3)]
[im 1/20]
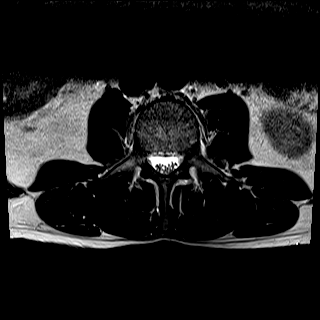
[im 4/20]
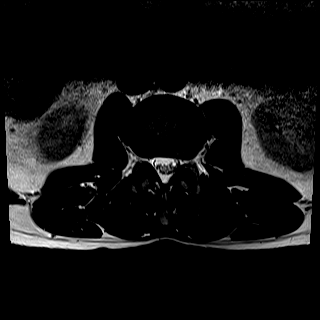
[im 8/20]
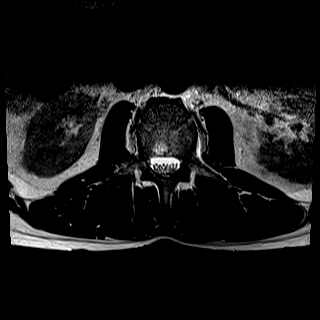
[im 12/20]
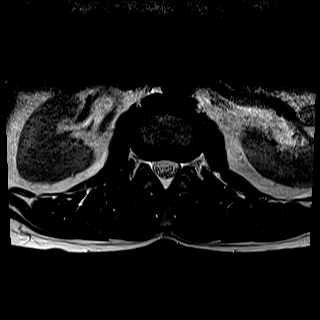
[im 16/20]
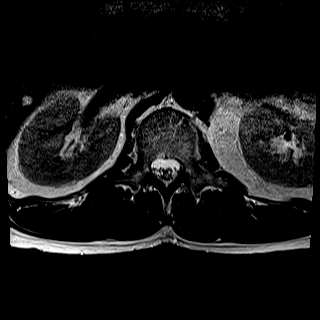
[im 20/20]
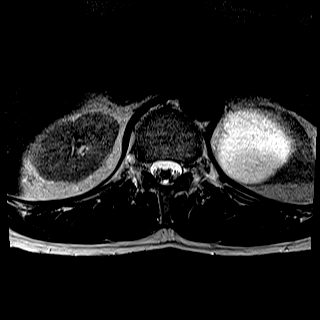

[Series 9: T2 · axial · 4.0mm · 0.62mm/px · z∈[-83,+20]mm · 6 of 22 slices shown (3 of 3)]
[im 1/22]
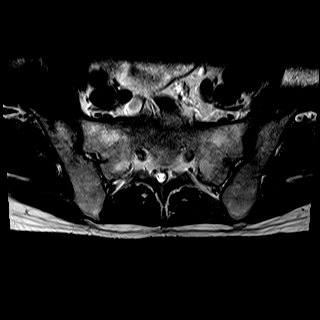
[im 5/22]
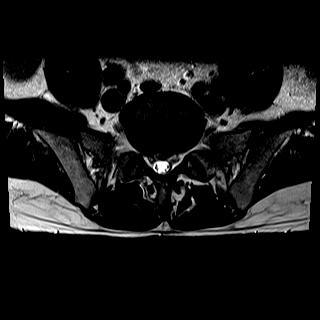
[im 9/22]
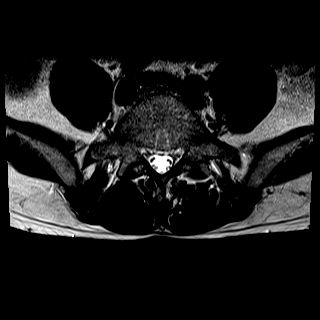
[im 13/22]
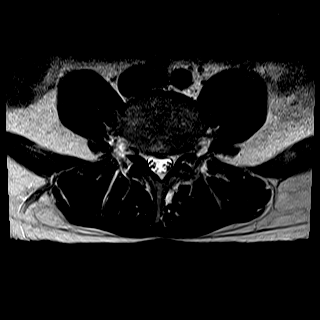
[im 17/22]
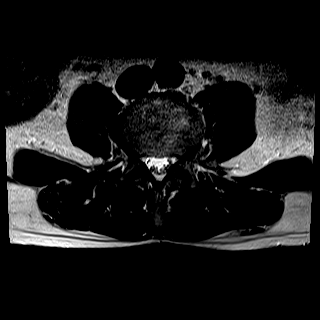
[im 22/22]
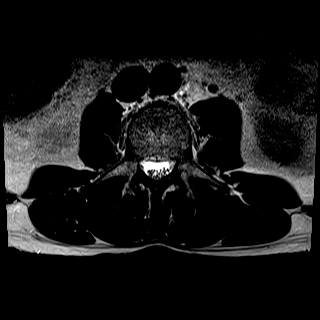

[Series 10: T1 · axial · 4.0mm · 0.62mm/px · z∈[+29,+124]mm · 6 of 20 slices shown (3 of 5)]
[im 1/20]
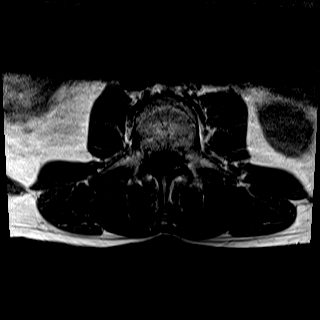
[im 4/20]
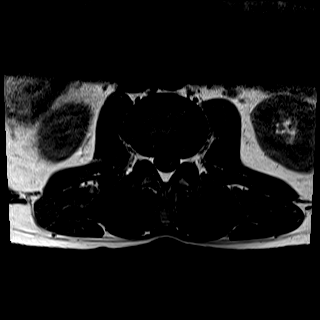
[im 8/20]
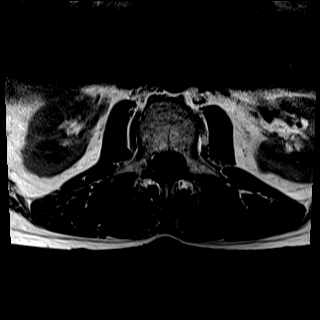
[im 12/20]
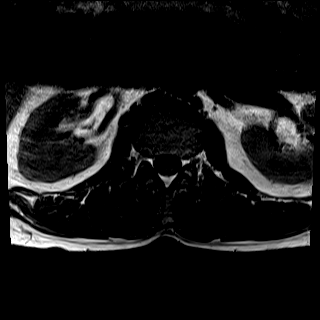
[im 16/20]
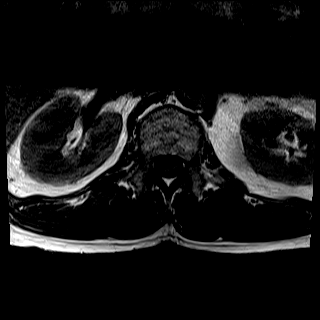
[im 20/20]
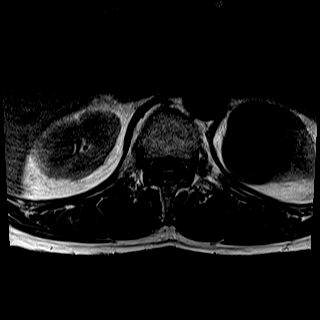

[Series 11: T1 · axial · 4.0mm · 0.62mm/px · z∈[-83,+20]mm · 6 of 22 slices shown (4 of 5)]
[im 1/22]
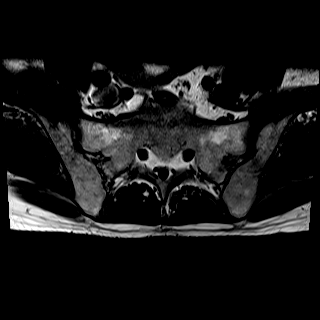
[im 5/22]
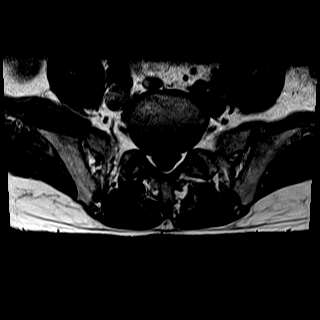
[im 9/22]
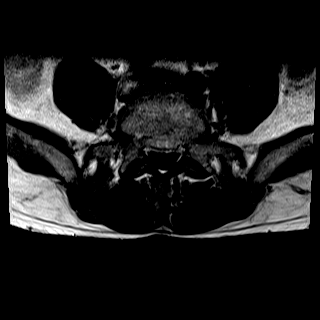
[im 13/22]
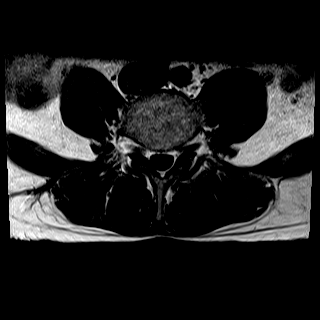
[im 17/22]
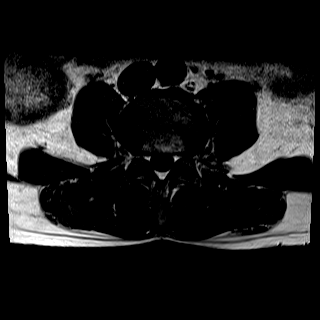
[im 22/22]
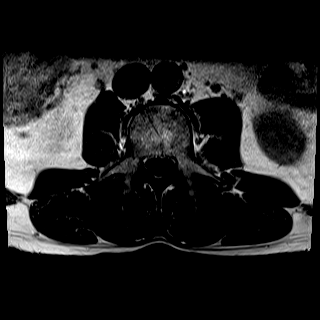

[Series 12: T1 · axial · 4.0mm · 0.62mm/px · z∈[+29,+124]mm · 6 of 20 slices shown (5 of 5)]
[im 1/20]
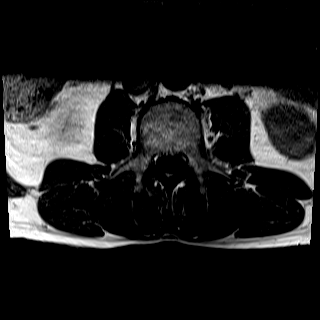
[im 4/20]
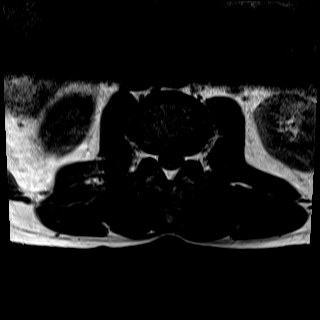
[im 8/20]
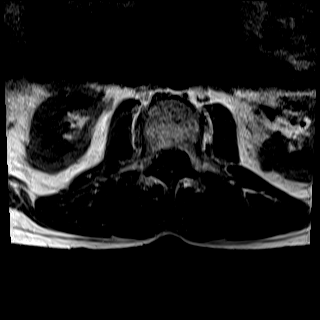
[im 12/20]
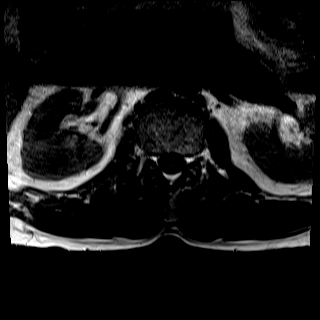
[im 16/20]
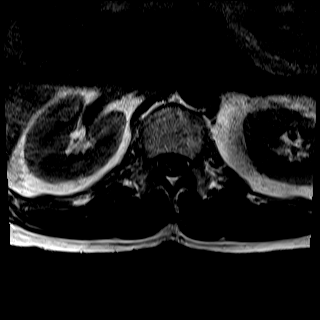
[im 20/20]
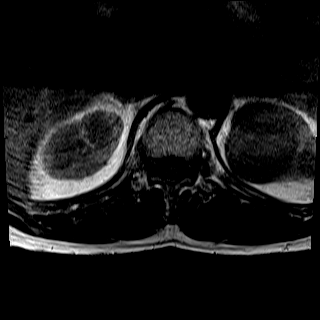

[44 of 48 positions shown; findings below may reference images not displayed]

FINDINGS: Multiple sequences are mildly motion degraded, requiring repeat
imaging.

SEGMENTATION: For the purposes of this report, the last well-formed
intervertebral disc will be described as L5-S1.

ALIGNMENT: Straightened lumbar lordosis. No malalignment.

VERTEBRAE:Vertebral bodies are intact. Moderate L5-S1 disc height
loss, remaining disc morphology's maintained with slight decreased
T2 signal within the lower lumbar disc compatible with mild
desiccation. Multilevel mild chronic discogenic endplate changes
without suspicious bone marrow signal.

CONUS MEDULLARIS: Conus medullaris terminates at L1-2 and
demonstrates normal morphology and signal characteristics. Cauda
equina is normal.

PARASPINAL AND SOFT TISSUES: Included prevertebral and paraspinal
soft tissues are nonacute. Partially imaged 4.8 cm LEFT renal cyst.

DISC LEVELS:

T12-L1 through L2-3: No disc bulge, canal stenosis nor neural
foraminal narrowing.

L3-4: Small broad-based disc bulge asymmetric to the LEFT. No canal
stenosis. Mild RIGHT, moderate LEFT neural foraminal narrowing.

L4-5: 5 mm broad-based central disc protrusion with undersurface
annular fissure. Partial effacement LEFT lateral recess could affect
the traversing LEFT L5 nerve. Mild ventral thecal sac effacement
without canal stenosis. Mild RIGHT, mild to moderate LEFT neural
foraminal narrowing.

L5-S1: 3 mm RIGHT central disc protrusion and annular fissure
contacts the traversing RIGHT S1 nerve within the lateral recess. No
canal stenosis or neural foraminal narrowing.
IMPRESSION: L4-5 and L5-S1 disc protrusions and annular fissures. Mild ventral
thecal sac effacement at L4-5 without canal stenosis at any level.

L3-4 through L5-S1 neural foraminal narrowing: Moderate on the LEFT
at L3-4.

## 2016-12-18 DIAGNOSIS — M62552 Muscle wasting and atrophy, not elsewhere classified, left thigh: Secondary | ICD-10-CM | POA: Diagnosis not present

## 2016-12-18 DIAGNOSIS — M545 Low back pain: Secondary | ICD-10-CM | POA: Diagnosis not present

## 2016-12-18 DIAGNOSIS — M5416 Radiculopathy, lumbar region: Secondary | ICD-10-CM | POA: Diagnosis not present

## 2016-12-18 DIAGNOSIS — M25521 Pain in right elbow: Secondary | ICD-10-CM | POA: Diagnosis not present

## 2016-12-22 DIAGNOSIS — M5416 Radiculopathy, lumbar region: Secondary | ICD-10-CM | POA: Diagnosis not present

## 2016-12-22 DIAGNOSIS — M62552 Muscle wasting and atrophy, not elsewhere classified, left thigh: Secondary | ICD-10-CM | POA: Diagnosis not present

## 2016-12-22 DIAGNOSIS — M545 Low back pain: Secondary | ICD-10-CM | POA: Diagnosis not present

## 2016-12-22 DIAGNOSIS — M25521 Pain in right elbow: Secondary | ICD-10-CM | POA: Diagnosis not present

## 2016-12-25 DIAGNOSIS — M25521 Pain in right elbow: Secondary | ICD-10-CM | POA: Diagnosis not present

## 2016-12-25 DIAGNOSIS — M5416 Radiculopathy, lumbar region: Secondary | ICD-10-CM | POA: Diagnosis not present

## 2016-12-25 DIAGNOSIS — M62552 Muscle wasting and atrophy, not elsewhere classified, left thigh: Secondary | ICD-10-CM | POA: Diagnosis not present

## 2016-12-25 DIAGNOSIS — M545 Low back pain: Secondary | ICD-10-CM | POA: Diagnosis not present

## 2017-01-14 ENCOUNTER — Encounter: Payer: Self-pay | Admitting: Family Medicine

## 2017-01-18 ENCOUNTER — Ambulatory Visit (INDEPENDENT_AMBULATORY_CARE_PROVIDER_SITE_OTHER): Payer: BLUE CROSS/BLUE SHIELD | Admitting: Family Medicine

## 2017-01-18 ENCOUNTER — Encounter: Payer: Self-pay | Admitting: Family Medicine

## 2017-01-18 VITALS — BP 118/70 | HR 53 | Temp 97.8°F | Resp 18 | Ht 69.0 in | Wt 169.0 lb

## 2017-01-18 DIAGNOSIS — E784 Other hyperlipidemia: Secondary | ICD-10-CM

## 2017-01-18 DIAGNOSIS — M5440 Lumbago with sciatica, unspecified side: Secondary | ICD-10-CM

## 2017-01-18 DIAGNOSIS — Z0000 Encounter for general adult medical examination without abnormal findings: Secondary | ICD-10-CM

## 2017-01-18 DIAGNOSIS — E7849 Other hyperlipidemia: Secondary | ICD-10-CM

## 2017-01-18 DIAGNOSIS — D649 Anemia, unspecified: Secondary | ICD-10-CM

## 2017-01-18 LAB — LIPID PANEL
CHOLESTEROL: 237 mg/dL — AB (ref 0–200)
HDL: 60.3 mg/dL (ref >39.00)
LDL CALC: 157 mg/dL — AB (ref 0–99)
NonHDL: 176.99
Total CHOL/HDL Ratio: 4
Triglycerides: 99 mg/dL (ref 0.0–149.0)
VLDL: 19.8 mg/dL (ref 0.0–40.0)

## 2017-01-18 LAB — CBC
HEMATOCRIT: 39.8 % (ref 39.0–52.0)
Hemoglobin: 13.5 g/dL (ref 13.0–17.0)
MCHC: 34 g/dL (ref 30.0–36.0)
MCV: 93.1 fl (ref 78.0–100.0)
Platelets: 253 10*3/uL (ref 150.0–400.0)
RBC: 4.27 Mil/uL (ref 4.22–5.81)
RDW: 13.6 % (ref 11.5–15.5)
WBC: 4.5 10*3/uL (ref 4.0–10.5)

## 2017-01-18 LAB — COMPREHENSIVE METABOLIC PANEL
ALBUMIN: 4.5 g/dL (ref 3.5–5.2)
ALK PHOS: 42 U/L (ref 39–117)
ALT: 22 U/L (ref 0–53)
AST: 23 U/L (ref 0–37)
BUN: 18 mg/dL (ref 6–23)
CO2: 28 mEq/L (ref 19–32)
Calcium: 9.7 mg/dL (ref 8.4–10.5)
Chloride: 103 mEq/L (ref 96–112)
Creatinine, Ser: 0.96 mg/dL (ref 0.40–1.50)
GFR: 87.31 mL/min (ref >60.00)
GLUCOSE: 94 mg/dL (ref 70–99)
Potassium: 4.1 mEq/L (ref 3.5–5.1)
Sodium: 140 mEq/L (ref 135–145)
TOTAL PROTEIN: 7 g/dL (ref 6.0–8.3)
Total Bilirubin: 0.5 mg/dL (ref 0.2–1.2)

## 2017-01-18 LAB — PSA: PSA: 1.55 ng/mL (ref 0.10–4.00)

## 2017-01-18 LAB — TSH: TSH: 1.31 u[IU]/mL (ref 0.35–4.50)

## 2017-01-18 NOTE — Progress Notes (Signed)
Subjective:  I acted as a Education administrator for Dr. Charlett Blake. [place], [place]  Patient ID: Marc Jenkins, male    DOB: 12/21/1965, 51 y.o.   MRN: [id number]  No chief complaint on file.   HPI  Patient is in today for an annual exam. Patient has no acute concerns. No recent febrile illness or acute hospitalizations. Denies CP/palp/SOB/HA/congestion/fevers/GI or GU c/o. Taking meds as prescribed. He was having a great deal of difficulty with his low back, left hip and left leg to the point that he lost a great deal of muscle mass. He has been following with chiropractic and [facility] [facility] and is doing much better. He has been released from regular visits and is continuing his exercises at home. No other recent illness or acute concerns. No hospitalizations. Continues to maintain a heart healthy diet and regular exercise. Denies CP/palp/SOB/HA/congestion/fevers/GI or GU c/o. Taking meds as prescribed    Patient Care Team: Mosie Lukes, MD as PCP - General   Past Medical History:  Diagnosis Date  . Allergy   . Chicken pox as a child  . Depression   . FH: colon cancer 05/04/2012  . H/O measles   . H/O mumps   . Headache, cluster   . Hyperlipidemia 05/05/2011  . Left hand pain 05/04/2012  . Measles as a child  . Mumps as a child  . Muscle cramp 11/12/2014  . Preventative health care 05/05/2011    Past Surgical History:  Procedure Laterality Date  . ACHILLES TENDON SURGERY  2-11  . TONSILLECTOMY AND ADENOIDECTOMY      Family History  Problem Relation Age of Onset  . Ulcers Mother        in colon  . Hyperlipidemia Mother   . Colon cancer Father 27  . Hyperlipidemia Sister   . Diabetes Sister        type 2/type 2  . Obesity Sister     Social History   Social History  . Marital status: Married    Spouse name: N/A  . Number of children: N/A  . Years of education: N/A   Occupational History  . Not on file.   Social History Main Topics  . Smoking status: Never  Smoker  . Smokeless tobacco: Never Used  . Alcohol use Yes     Comment: 2 glass of wine or a beer nightly  . Drug use: No  . Sexual activity: Yes    Partners: Female   Other Topics Concern  . Not on file   Social History Narrative  . No narrative on file    Outpatient Medications Prior to Visit  Medication Sig Dispense Refill  . Cholecalciferol (VITAMIN D3) 5000 UNITS TABS Take by mouth.    . COD LIVER OIL PO Take by mouth.    . Multiple Vitamin (MULTIVITAMIN) capsule Take 1 capsule by mouth daily.    . NON FORMULARY MTHR=B 6- 2 daily    . OVER THE COUNTER MEDICATION Chloroplex- 2 daily    . azithromycin (ZITHROMAX) 250 MG tablet Take 2 tablets once then 1 by mouth daily for 4 days. 6 each 0  . methylPREDNISolone (MEDROL) 4 MG tablet 6 tabs po x 1 d then 5 tabs pox 1 d then 4 tabs po x 1 day then 3 tabs po x 1 d then 2 tabs po x 1 day then 1 tabs po x 1 day 21 tablet 0  . pyridOXINE (VITAMIN B-6) 100 MG tablet Take 100 mg by  mouth daily.    Marland Kitchen tiZANidine (ZANAFLEX) 4 MG tablet Take 1 tablet (4 mg total) by mouth every 8 (eight) hours as needed for muscle spasms. 40 tablet 1   No facility-administered medications prior to visit.     No Known Allergies  Review of Systems  Constitutional: Negative for fever and malaise/fatigue.  HENT: Negative for congestion.   Eyes: Negative for blurred vision.  Respiratory: Negative for cough and shortness of breath.   Cardiovascular: Negative for chest pain, palpitations and leg swelling.  Gastrointestinal: Negative for vomiting.  Musculoskeletal: Negative for back pain.  Skin: Negative for rash.  Neurological: Negative for loss of consciousness and headaches.       Objective:    Physical Exam  Constitutional: He is oriented to person, place, and time. He appears well-developed and well-nourished. No distress.  HENT:  Head: Normocephalic and atraumatic.  Eyes: Conjunctivae are normal.  Neck: Normal range of motion. Neck supple. No  thyromegaly present.  Cardiovascular: Normal rate, regular rhythm and normal heart sounds.   No murmur heard. Pulmonary/Chest: Effort normal and breath sounds normal. No respiratory distress. He has no wheezes.  Abdominal: Soft. Bowel sounds are normal. He exhibits no mass. There is no tenderness.  Musculoskeletal: Normal range of motion. He exhibits no edema or deformity.  Lymphadenopathy:    He has no cervical adenopathy.  Neurological: He is alert and oriented to person, place, and time.  Skin: Skin is warm and dry. He is not diaphoretic.  Psychiatric: He has a normal mood and affect. His behavior is normal.    BP 118/70 (BP Location: Left Arm, Patient Position: Sitting, Cuff Size: Normal)   Pulse (!) 53   Temp 97.8 F (36.6 C) (Oral)   Resp 18   Ht 5\' 9"  (1.753 m)   Wt 169 lb (76.7 kg)   SpO2 98%   BMI 24.96 kg/m  Wt Readings from Last 3 Encounters:  01/18/17 169 lb (76.7 kg)  04/24/16 167 lb 6.4 oz (75.9 kg)  01/17/16 167 lb 2 oz (75.8 kg)   BP Readings from Last 3 Encounters:  01/18/17 118/70  04/24/16 122/82  01/17/16 128/84     Immunization History  Administered Date(s) Administered  . PPD Test 11/10/2011, 12/13/2013  . Tdap 07/20/2004, 05/07/2014    Health Maintenance  Topic Date Due  . HIV Screening  09/21/1979  . INFLUENZA VACCINE  02/17/2017  . COLONOSCOPY  01/17/2020  . TETANUS/TDAP  05/07/2024    Lab Results  Component Value Date   WBC 4.5 01/18/2017   HGB 13.5 01/18/2017   HCT 39.8 01/18/2017   PLT 253.0 01/18/2017   GLUCOSE 94 01/18/2017   CHOL 237 (H) 01/18/2017   TRIG 99.0 01/18/2017   HDL 60.30 01/18/2017   LDLDIRECT 201.4 05/04/2012   LDLCALC 157 (H) 01/18/2017   ALT 22 01/18/2017   AST 23 01/18/2017   NA 140 01/18/2017   K 4.1 01/18/2017   CL 103 01/18/2017   CREATININE 0.96 01/18/2017   BUN 18 01/18/2017   CO2 28 01/18/2017   TSH 1.31 01/18/2017   PSA 1.55 01/18/2017    Lab Results  Component Value Date   TSH 1.31  01/18/2017   Lab Results  Component Value Date   WBC 4.5 01/18/2017   HGB 13.5 01/18/2017   HCT 39.8 01/18/2017   MCV 93.1 01/18/2017   PLT 253.0 01/18/2017   Lab Results  Component Value Date   NA 140 01/18/2017   K 4.1 01/18/2017  CO2 28 01/18/2017   GLUCOSE 94 01/18/2017   BUN 18 01/18/2017   CREATININE 0.96 01/18/2017   BILITOT 0.5 01/18/2017   ALKPHOS 42 01/18/2017   AST 23 01/18/2017   ALT 22 01/18/2017   PROT 7.0 01/18/2017   ALBUMIN 4.5 01/18/2017   CALCIUM 9.7 01/18/2017   GFR 87.31 01/18/2017   Lab Results  Component Value Date   CHOL 237 (H) 01/18/2017   Lab Results  Component Value Date   HDL 60.30 01/18/2017   Lab Results  Component Value Date   LDLCALC 157 (H) 01/18/2017   Lab Results  Component Value Date   TRIG 99.0 01/18/2017   Lab Results  Component Value Date   CHOLHDL 4 01/18/2017   No results found for: HGBA1C       Assessment & Plan:   Problem List Items Addressed This Visit    Hyperlipidemia    Encouraged heart healthy diet, increase exercise, avoid trans fats, consider a krill oil cap daily      Relevant Orders   Lipid panel (Completed)   Lipid panel   Preventative health care - Primary    Patient encouraged to maintain heart healthy diet, regular exercise, adequate sleep. Consider daily probiotics. Take medications as prescribed      Relevant Orders   TSH (Completed)   Comprehensive metabolic panel (Completed)   PSA (Completed)   Comprehensive metabolic panel   TSH   PSA   Acute left-sided low back pain with sciatica    Has just been released from [facility] after very extensive therapy for 8 months. He lost 30 % of his muscle mass in left thigh, it is largely resolved with exercise       Other Visit Diagnoses    Anemia, unspecified type       Relevant Orders   CBC (Completed)   CBC with Differential/Platelet      I have discontinued Mr. Frisk pyridOXINE, tiZANidine, methylPREDNISolone, and azithromycin. I  am also having him maintain his COD LIVER OIL PO, multivitamin, OVER THE COUNTER MEDICATION, NON FORMULARY, and Vitamin D3.  No orders of the defined types were placed in this encounter.   CMA served as Education administrator during this visit. History, Physical and Plan performed by medical provider. Documentation and orders reviewed and attested to.  Penni Homans, MD

## 2017-01-18 NOTE — Assessment & Plan Note (Signed)
Encouraged heart healthy diet, increase exercise, avoid trans fats, consider a krill oil cap daily 

## 2017-01-18 NOTE — Assessment & Plan Note (Signed)
Patient encouraged to maintain heart healthy diet, regular exercise, adequate sleep. Consider daily probiotics. Take medications as prescribed 

## 2017-01-18 NOTE — Assessment & Plan Note (Signed)
Has just been released from [facility] after very extensive therapy for 8 months. He lost 30 % of his muscle mass in left thigh, it is largely resolved with exercise

## 2017-01-18 NOTE — Patient Instructions (Signed)
Shingrix 2 shots over 6 months, call insurance to confirm they will cover Preventive Care 40-64 Years, Male Preventive care refers to lifestyle choices and visits with your health care provider that can promote health and wellness. What does preventive care include?  A yearly physical exam. This is also called an annual well check.  Dental exams once or twice a year.  Routine eye exams. Ask your health care provider how often you should have your eyes checked.  Personal lifestyle choices, including: ? Daily care of your teeth and gums. ? Regular physical activity. ? Eating a healthy diet. ? Avoiding tobacco and drug use. ? Limiting alcohol use. ? Practicing safe sex. ? Taking low-dose aspirin every day starting at age 52. What happens during an annual well check? The services and screenings done by your health care provider during your annual well check will depend on your age, overall health, lifestyle risk factors, and family history of disease. Counseling Your health care provider may ask you questions about your:  Alcohol use.  Tobacco use.  Drug use.  Emotional well-being.  Home and relationship well-being.  Sexual activity.  Eating habits.  Work and work Statistician.  Screening You may have the following tests or measurements:  Height, weight, and BMI.  Blood pressure.  Lipid and cholesterol levels. These may be checked every 5 years, or more frequently if you are over 52 years old.  Skin check.  Lung cancer screening. You may have this screening every year starting at age 52 if you have a 30-pack-year history of smoking and currently smoke or have quit within the past 15 years.  Fecal occult blood test (FOBT) of the stool. You may have this test every year starting at age 52.  Flexible sigmoidoscopy or colonoscopy. You may have a sigmoidoscopy every 5 years or a colonoscopy every 10 years starting at age 52.  Prostate cancer screening. Recommendations  will vary depending on your family history and other risks.  Hepatitis C blood test.  Hepatitis B blood test.  Sexually transmitted disease (STD) testing.  Diabetes screening. This is done by checking your blood sugar (glucose) after you have not eaten for a while (fasting). You may have this done every 1-3 years.  Discuss your test results, treatment options, and if necessary, the need for more tests with your health care provider. Vaccines Your health care provider may recommend certain vaccines, such as:  Influenza vaccine. This is recommended every year.  Tetanus, diphtheria, and acellular pertussis (Tdap, Td) vaccine. You may need a Td booster every 10 years.  Varicella vaccine. You may need this if you have not been vaccinated.  Zoster vaccine. You may need this after age 52.  Measles, mumps, and rubella (MMR) vaccine. You may need at least one dose of MMR if you were born in 1957 or later. You may also need a second dose.  Pneumococcal 13-valent conjugate (PCV13) vaccine. You may need this if you have certain conditions and have not been vaccinated.  Pneumococcal polysaccharide (PPSV23) vaccine. You may need one or two doses if you smoke cigarettes or if you have certain conditions.  Meningococcal vaccine. You may need this if you have certain conditions.  Hepatitis A vaccine. You may need this if you have certain conditions or if you travel or work in places where you may be exposed to hepatitis A.  Hepatitis B vaccine. You may need this if you have certain conditions or if you travel or work in places where you  may be exposed to hepatitis B.  Haemophilus influenzae type b (Hib) vaccine. You may need this if you have certain risk factors.  Talk to your health care provider about which screenings and vaccines you need and how often you need them. This information is not intended to replace advice given to you by your health care provider. Make sure you discuss any  questions you have with your health care provider. Document Released: 08/02/2015 Document Revised: 03/25/2016 Document Reviewed: 05/07/2015 Elsevier Interactive Patient Education  2017 [employer].

## 2017-02-22 DIAGNOSIS — F82 Specific developmental disorder of motor function: Secondary | ICD-10-CM | POA: Diagnosis not present

## 2017-02-22 DIAGNOSIS — D509 Iron deficiency anemia, unspecified: Secondary | ICD-10-CM | POA: Diagnosis not present

## 2017-02-22 DIAGNOSIS — G43009 Migraine without aura, not intractable, without status migrainosus: Secondary | ICD-10-CM | POA: Diagnosis not present

## 2017-02-22 DIAGNOSIS — E509 Vitamin A deficiency, unspecified: Secondary | ICD-10-CM | POA: Diagnosis not present

## 2017-02-22 DIAGNOSIS — E559 Vitamin D deficiency, unspecified: Secondary | ICD-10-CM | POA: Diagnosis not present

## 2017-03-15 DIAGNOSIS — D509 Iron deficiency anemia, unspecified: Secondary | ICD-10-CM | POA: Diagnosis not present

## 2017-03-15 DIAGNOSIS — E559 Vitamin D deficiency, unspecified: Secondary | ICD-10-CM | POA: Diagnosis not present

## 2017-03-15 DIAGNOSIS — F82 Specific developmental disorder of motor function: Secondary | ICD-10-CM | POA: Diagnosis not present

## 2017-03-15 DIAGNOSIS — E509 Vitamin A deficiency, unspecified: Secondary | ICD-10-CM | POA: Diagnosis not present

## 2017-03-15 DIAGNOSIS — G43009 Migraine without aura, not intractable, without status migrainosus: Secondary | ICD-10-CM | POA: Diagnosis not present

## 2017-03-24 ENCOUNTER — Encounter: Payer: Self-pay | Admitting: Family Medicine

## 2017-04-13 DIAGNOSIS — M79602 Pain in left arm: Secondary | ICD-10-CM | POA: Diagnosis not present

## 2017-07-06 DIAGNOSIS — L814 Other melanin hyperpigmentation: Secondary | ICD-10-CM | POA: Diagnosis not present

## 2017-07-06 DIAGNOSIS — D1800 Hemangioma unspecified site: Secondary | ICD-10-CM | POA: Diagnosis not present

## 2017-07-06 DIAGNOSIS — D225 Melanocytic nevi of trunk: Secondary | ICD-10-CM | POA: Diagnosis not present

## 2017-07-06 DIAGNOSIS — L821 Other seborrheic keratosis: Secondary | ICD-10-CM | POA: Diagnosis not present

## 2018-01-27 ENCOUNTER — Encounter: Payer: BLUE CROSS/BLUE SHIELD | Admitting: Family Medicine

## 2018-02-16 DIAGNOSIS — M5416 Radiculopathy, lumbar region: Secondary | ICD-10-CM | POA: Diagnosis not present

## 2018-03-01 DIAGNOSIS — M5416 Radiculopathy, lumbar region: Secondary | ICD-10-CM | POA: Diagnosis not present

## 2018-03-16 DIAGNOSIS — M5416 Radiculopathy, lumbar region: Secondary | ICD-10-CM | POA: Diagnosis not present

## 2018-03-18 DIAGNOSIS — E559 Vitamin D deficiency, unspecified: Secondary | ICD-10-CM | POA: Diagnosis not present

## 2018-03-18 DIAGNOSIS — G43009 Migraine without aura, not intractable, without status migrainosus: Secondary | ICD-10-CM | POA: Diagnosis not present

## 2018-03-18 DIAGNOSIS — E509 Vitamin A deficiency, unspecified: Secondary | ICD-10-CM | POA: Diagnosis not present

## 2018-03-18 DIAGNOSIS — D509 Iron deficiency anemia, unspecified: Secondary | ICD-10-CM | POA: Diagnosis not present

## 2018-03-18 DIAGNOSIS — F82 Specific developmental disorder of motor function: Secondary | ICD-10-CM | POA: Diagnosis not present

## 2018-03-22 ENCOUNTER — Encounter: Payer: BLUE CROSS/BLUE SHIELD | Admitting: Family Medicine

## 2018-04-22 ENCOUNTER — Encounter: Payer: Self-pay | Admitting: Family Medicine

## 2018-04-26 ENCOUNTER — Encounter: Payer: Self-pay | Admitting: Family Medicine

## 2018-04-26 ENCOUNTER — Ambulatory Visit (INDEPENDENT_AMBULATORY_CARE_PROVIDER_SITE_OTHER): Payer: BLUE CROSS/BLUE SHIELD | Admitting: Family Medicine

## 2018-04-26 ENCOUNTER — Encounter

## 2018-04-26 VITALS — BP 130/80 | HR 52 | Temp 97.9°F | Resp 18 | Wt 161.4 lb

## 2018-04-26 DIAGNOSIS — G44009 Cluster headache syndrome, unspecified, not intractable: Secondary | ICD-10-CM

## 2018-04-26 DIAGNOSIS — E785 Hyperlipidemia, unspecified: Secondary | ICD-10-CM

## 2018-04-26 DIAGNOSIS — F329 Major depressive disorder, single episode, unspecified: Secondary | ICD-10-CM | POA: Diagnosis not present

## 2018-04-26 DIAGNOSIS — Z0000 Encounter for general adult medical examination without abnormal findings: Secondary | ICD-10-CM

## 2018-04-26 DIAGNOSIS — F32A Depression, unspecified: Secondary | ICD-10-CM

## 2018-04-26 LAB — LIPID PANEL
Cholesterol: 288 mg/dL — ABNORMAL HIGH (ref 0–200)
HDL: 61.8 mg/dL (ref >39.00)
LDL Cholesterol: 208 mg/dL — ABNORMAL HIGH (ref 0–99)
NonHDL: 226.35
TRIGLYCERIDES: 93 mg/dL (ref 0.0–149.0)
Total CHOL/HDL Ratio: 5
VLDL: 18.6 mg/dL (ref 0.0–40.0)

## 2018-04-26 LAB — COMPREHENSIVE METABOLIC PANEL
ALK PHOS: 45 U/L (ref 39–117)
ALT: 14 U/L (ref 0–53)
AST: 18 U/L (ref 0–37)
Albumin: 4.7 g/dL (ref 3.5–5.2)
BILIRUBIN TOTAL: 0.6 mg/dL (ref 0.2–1.2)
BUN: 15 mg/dL (ref 6–23)
CALCIUM: 9.8 mg/dL (ref 8.4–10.5)
CO2: 28 mEq/L (ref 19–32)
Chloride: 103 mEq/L (ref 96–112)
Creatinine, Ser: 1.07 mg/dL (ref 0.40–1.50)
GFR: 76.66 mL/min (ref >60.00)
GLUCOSE: 96 mg/dL (ref 70–99)
POTASSIUM: 4.6 meq/L (ref 3.5–5.1)
Sodium: 139 mEq/L (ref 135–145)
TOTAL PROTEIN: 7.3 g/dL (ref 6.0–8.3)

## 2018-04-26 LAB — CBC
HEMATOCRIT: 42.3 % (ref 39.0–52.0)
HEMOGLOBIN: 13.8 g/dL (ref 13.0–17.0)
MCHC: 32.7 g/dL (ref 30.0–36.0)
MCV: 93.9 fl (ref 78.0–100.0)
PLATELETS: 273 10*3/uL (ref 150.0–400.0)
RBC: 4.5 Mil/uL (ref 4.22–5.81)
RDW: 13.6 % (ref 11.5–15.5)
WBC: 3.9 10*3/uL — ABNORMAL LOW (ref 4.0–10.5)

## 2018-04-26 LAB — TSH: TSH: 1.52 u[IU]/mL (ref 0.35–4.50)

## 2018-04-26 NOTE — Assessment & Plan Note (Signed)
Had a bad flare in August after staying with his Mom, he is concerned that his Mom house had black mold. resolved

## 2018-04-26 NOTE — Patient Instructions (Addendum)
Labs are activated as of Septmber 15, 2020 at [facility] prior to your annual exam on 04/20/2019  Advanced Directives and Living Will in chart  Badger headache stick Preventive Care 40-64 Years, Male Preventive care refers to lifestyle choices and visits with your health care provider that can promote health and wellness. What does preventive care include?  A yearly physical exam. This is also called an annual well check.  Dental exams once or twice a year.  Routine eye exams. Ask your health care provider how often you should have your eyes checked.  Personal lifestyle choices, including: ? Daily care of your teeth and gums. ? Regular physical activity. ? Eating a healthy diet. ? Avoiding tobacco and drug use. ? Limiting alcohol use. ? Practicing safe sex. ? Taking low-dose aspirin every day starting at age 40. What happens during an annual well check? The services and screenings done by your health care provider during your annual well check will depend on your age, overall health, lifestyle risk factors, and family history of disease. Counseling Your health care provider may ask you questions about your:  Alcohol use.  Tobacco use.  Drug use.  Emotional well-being.  Home and relationship well-being.  Sexual activity.  Eating habits.  Work and work Statistician.  Screening You may have the following tests or measurements:  Height, weight, and BMI.  Blood pressure.  Lipid and cholesterol levels. These may be checked every 5 years, or more frequently if you are over 75 years old.  Skin check.  Lung cancer screening. You may have this screening every year starting at age 56 if you have a 30-pack-year history of smoking and currently smoke or have quit within the past 15 years.  Fecal occult blood test (FOBT) of the stool. You may have this test every year starting at age 64.  Flexible sigmoidoscopy or colonoscopy. You may have a sigmoidoscopy every 5 years or a  colonoscopy every 10 years starting at age 73.  Prostate cancer screening. Recommendations will vary depending on your family history and other risks.  Hepatitis C blood test.  Hepatitis B blood test.  Sexually transmitted disease (STD) testing.  Diabetes screening. This is done by checking your blood sugar (glucose) after you have not eaten for a while (fasting). You may have this done every 1-3 years.  Discuss your test results, treatment options, and if necessary, the need for more tests with your health care provider. Vaccines Your health care provider may recommend certain vaccines, such as:  Influenza vaccine. This is recommended every year.  Tetanus, diphtheria, and acellular pertussis (Tdap, Td) vaccine. You may need a Td booster every 10 years.  Varicella vaccine. You may need this if you have not been vaccinated.  Zoster vaccine. You may need this after age 72.  Measles, mumps, and rubella (MMR) vaccine. You may need at least one dose of MMR if you were born in 1957 or later. You may also need a second dose.  Pneumococcal 13-valent conjugate (PCV13) vaccine. You may need this if you have certain conditions and have not been vaccinated.  Pneumococcal polysaccharide (PPSV23) vaccine. You may need one or two doses if you smoke cigarettes or if you have certain conditions.  Meningococcal vaccine. You may need this if you have certain conditions.  Hepatitis A vaccine. You may need this if you have certain conditions or if you travel or work in places where you may be exposed to hepatitis A.  Hepatitis B vaccine. You may need  this if you have certain conditions or if you travel or work in places where you may be exposed to hepatitis B.  Haemophilus influenzae type b (Hib) vaccine. You may need this if you have certain risk factors.  Talk to your health care provider about which screenings and vaccines you need and how often you need them. This information is not intended to  replace advice given to you by your health care provider. Make sure you discuss any questions you have with your health care provider. Document Released: 08/02/2015 Document Revised: 03/25/2016 Document Reviewed: 05/07/2015 Elsevier Interactive Patient Education  [employer].

## 2018-04-26 NOTE — Telephone Encounter (Signed)
Patient was seen today.

## 2018-05-01 NOTE — Assessment & Plan Note (Signed)
Was noting an increase in anxiety but he feels this has improved since making some dietary changes.

## 2018-05-01 NOTE — Assessment & Plan Note (Signed)
Patient encouraged to maintain heart healthy diet, regular exercise, adequate sleep. Consider daily probiotics. Take medications as prescribed. He declines immmunizations. Last colonoscopy 2016 due next 2021. Continues to follow with Dr Arville Lime a naturopath and he feels the dietary changes and supplements are helping his headaches and anxiety better.

## 2018-05-01 NOTE — Progress Notes (Signed)
Subjective:    Patient ID: Marc Jenkins, male    DOB: 09-Jul-1966, 52 y.o.   MRN: [id number]  No chief complaint on file.   HPI Patient is in today for annual preventative exam. No recent febrile illness or hospitalizations. He denies any acute injury but he does note has had trouble with his left side most notably his leg since an accident in 2001. He was having a flare in his cluster migraines and a flare in anxiety until he cahnged his diet and now he feels bette. No corn, peanuts and more. He is doing well with activities of daily living and he continues to travel a great deal for work. Denies CP/palp/SOB/HA/congestion/fevers/GI or GU c/o. Taking meds as prescribed  Past Medical History:  Diagnosis Date  . Allergy   . Chicken pox as a child  . Depression   . FH: colon cancer 05/04/2012  . H/O measles   . H/O mumps   . Headache, cluster   . Hyperlipidemia 05/05/2011  . Left hand pain 05/04/2012  . Measles as a child  . Mumps as a child  . Muscle cramp 11/12/2014  . Preventative health care 05/05/2011    Past Surgical History:  Procedure Laterality Date  . ACHILLES TENDON SURGERY  2-11  . TONSILLECTOMY AND ADENOIDECTOMY      Family History  Problem Relation Age of Onset  . Ulcers Mother        in colon  . Hyperlipidemia Mother   . Colon cancer Father 22  . Hyperlipidemia Sister   . Diabetes Sister        type 2/type 2  . Obesity Sister   . Diabetes Sister   . Hyperlipidemia Sister   . Obesity Sister     Social History   Socioeconomic History  . Marital status: Married    Spouse name: Not on file  . Number of children: Not on file  . Years of education: Not on file  . Highest education level: Not on file  Occupational History  . Not on file  Social Needs  . Financial resource strain: Not on file  . Food insecurity:    Worry: Not on file    Inability: Not on file  . Transportation needs:    Medical: Not on file    Non-medical: Not on file    Tobacco Use  . Smoking status: Never Smoker  . Smokeless tobacco: Never Used  Substance and Sexual Activity  . Alcohol use: Yes    Comment: 2 glass of wine or a beer nightly  . Drug use: No  . Sexual activity: Yes    Partners: Female  Lifestyle  . Physical activity:    Days per week: Not on file    Minutes per session: Not on file  . Stress: Not on file  Relationships  . Social connections:    Talks on phone: Not on file    Gets together: Not on file    Attends religious service: Not on file    Active member of club or organization: Not on file    Attends meetings of clubs or organizations: Not on file    Relationship status: Not on file  . Intimate partner violence:    Fear of current or ex partner: Not on file    Emotionally abused: Not on file    Physically abused: Not on file    Forced sexual activity: Not on file  Other Topics Concern  . Not on  file  Social History Narrative  . Not on file    Outpatient Medications Prior to Visit  Medication Sig Dispense Refill  . Cholecalciferol (VITAMIN D3) 5000 UNITS TABS Take by mouth.    . COD LIVER OIL PO Take by mouth.    . Multiple Vitamin (MULTIVITAMIN) capsule Take 1 capsule by mouth daily.    . NON FORMULARY MTHR=B 6- 2 daily    . OVER THE COUNTER MEDICATION Chloroplex- 2 daily     No facility-administered medications prior to visit.     No Known Allergies  Review of Systems  Constitutional: Negative for fever and malaise/fatigue.  HENT: Negative for congestion.   Eyes: Negative for blurred vision.  Respiratory: Negative for shortness of breath.   Cardiovascular: Negative for chest pain, palpitations and leg swelling.  Gastrointestinal: Negative for abdominal pain, blood in stool and nausea.  Genitourinary: Negative for dysuria and frequency.  Musculoskeletal: Negative for falls.  Skin: Negative for rash.  Neurological: Negative for dizziness, loss of consciousness and headaches.  Endo/Heme/Allergies:  Negative for environmental allergies.  Psychiatric/Behavioral: Negative for depression. The patient is not nervous/anxious.        Objective:    Physical Exam  Constitutional: He is oriented to person, place, and time. He appears well-developed and well-nourished. No distress.  HENT:  Head: Normocephalic and atraumatic.  Nose: Nose normal.  Eyes: Right eye exhibits no discharge. Left eye exhibits no discharge.  Neck: Normal range of motion. Neck supple.  Cardiovascular: Normal rate and regular rhythm.  No murmur heard. Pulmonary/Chest: Effort normal and breath sounds normal.  Abdominal: Soft. Bowel sounds are normal. There is no tenderness.  Musculoskeletal: He exhibits no edema.  Neurological: He is alert and oriented to person, place, and time.  Skin: Skin is warm and dry.  Psychiatric: He has a normal mood and affect.  Nursing note and vitals reviewed.   BP 130/80 (BP Location: Left Arm, Patient Position: Sitting, Cuff Size: Normal)   Pulse (!) 52   Temp 97.9 F (36.6 C) (Oral)   Resp 18   Wt 161 lb 6.4 oz (73.2 kg)   SpO2 97%   BMI 23.83 kg/m  Wt Readings from Last 3 Encounters:  04/26/18 161 lb 6.4 oz (73.2 kg)  01/18/17 169 lb (76.7 kg)  04/24/16 167 lb 6.4 oz (75.9 kg)     Lab Results  Component Value Date   WBC 3.9 (L) 04/26/2018   HGB 13.8 04/26/2018   HCT 42.3 04/26/2018   PLT 273.0 04/26/2018   GLUCOSE 96 04/26/2018   CHOL 288 (H) 04/26/2018   TRIG 93.0 04/26/2018   HDL 61.80 04/26/2018   LDLDIRECT 201.4 05/04/2012   LDLCALC 208 (H) 04/26/2018   ALT 14 04/26/2018   AST 18 04/26/2018   NA 139 04/26/2018   K 4.6 04/26/2018   CL 103 04/26/2018   CREATININE 1.07 04/26/2018   BUN 15 04/26/2018   CO2 28 04/26/2018   TSH 1.52 04/26/2018   PSA 1.55 01/18/2017    Lab Results  Component Value Date   TSH 1.52 04/26/2018   Lab Results  Component Value Date   WBC 3.9 (L) 04/26/2018   HGB 13.8 04/26/2018   HCT 42.3 04/26/2018   MCV 93.9  04/26/2018   PLT 273.0 04/26/2018   Lab Results  Component Value Date   NA 139 04/26/2018   K 4.6 04/26/2018   CO2 28 04/26/2018   GLUCOSE 96 04/26/2018   BUN 15 04/26/2018   CREATININE 1.07  04/26/2018   BILITOT 0.6 04/26/2018   ALKPHOS 45 04/26/2018   AST 18 04/26/2018   ALT 14 04/26/2018   PROT 7.3 04/26/2018   ALBUMIN 4.7 04/26/2018   CALCIUM 9.8 04/26/2018   GFR 76.66 04/26/2018   Lab Results  Component Value Date   CHOL 288 (H) 04/26/2018   Lab Results  Component Value Date   HDL 61.80 04/26/2018   Lab Results  Component Value Date   LDLCALC 208 (H) 04/26/2018   Lab Results  Component Value Date   TRIG 93.0 04/26/2018   Lab Results  Component Value Date   CHOLHDL 5 04/26/2018   No results found for: HGBA1C     Assessment & Plan:   Problem List Items Addressed This Visit    Cluster headache syndrome    Had a bad flare in August after staying with his Mom, he is concerned that his Mom house had black mold. resolved      Depression    Was noting an increase in anxiety but he feels this has improved since making some dietary changes.      Hyperlipidemia    Encouraged heart healthy diet, increase exercise, avoid trans fats, consider a krill oil cap daily      Relevant Orders   Lipid panel   Preventative health care - Primary    Patient encouraged to maintain heart healthy diet, regular exercise, adequate sleep. Consider daily probiotics. Take medications as prescribed. He declines immmunizations. Last colonoscopy 2016 due next 2021. Continues to follow with Dr Arville Lime a naturopath and he feels the dietary changes and supplements are helping his headaches and anxiety better.       Relevant Orders   CBC (Completed)   Comprehensive metabolic panel (Completed)   Lipid panel (Completed)   TSH (Completed)   CBC   Comprehensive metabolic panel   TSH      I am having Aaron Mose. Nicole Kindred "Dave" maintain his COD LIVER OIL PO, multivitamin, OVER THE COUNTER  MEDICATION, NON FORMULARY, and Vitamin D3.  No orders of the defined types were placed in this encounter.    Penni Homans, MD

## 2018-05-01 NOTE — Assessment & Plan Note (Signed)
Encouraged heart healthy diet, increase exercise, avoid trans fats, consider a krill oil cap daily 

## 2019-03-09 DIAGNOSIS — M79602 Pain in left arm: Secondary | ICD-10-CM | POA: Diagnosis not present

## 2019-03-17 DIAGNOSIS — M79602 Pain in left arm: Secondary | ICD-10-CM | POA: Diagnosis not present

## 2019-03-29 DIAGNOSIS — M79602 Pain in left arm: Secondary | ICD-10-CM | POA: Diagnosis not present

## 2019-04-21 ENCOUNTER — Telehealth: Payer: Self-pay

## 2019-04-21 NOTE — Telephone Encounter (Signed)
Copied from [place] [id number]. Topic: Appointment Scheduling - Scheduling Inquiry for Clinic >> Apr 21, 2019  3:20 PM Yvette Rack wrote: Reason for CRM: Pt stated he was told that he would be getting a call back to schedule an appt for his annual physical either in late October or November. Attempted to schedule the appt but there was no availability for a physical with Dr. Charlett Blake in October or November. Pt requests call back.

## 2019-04-24 ENCOUNTER — Encounter: Payer: Self-pay | Admitting: Family Medicine

## 2019-05-01 ENCOUNTER — Encounter: Payer: BLUE CROSS/BLUE SHIELD | Admitting: Family Medicine

## 2019-05-09 ENCOUNTER — Other Ambulatory Visit (INDEPENDENT_AMBULATORY_CARE_PROVIDER_SITE_OTHER): Payer: BC Managed Care – PPO

## 2019-05-09 ENCOUNTER — Other Ambulatory Visit: Payer: Self-pay

## 2019-05-09 DIAGNOSIS — E785 Hyperlipidemia, unspecified: Secondary | ICD-10-CM

## 2019-05-09 DIAGNOSIS — Z0000 Encounter for general adult medical examination without abnormal findings: Secondary | ICD-10-CM

## 2019-05-09 LAB — COMPREHENSIVE METABOLIC PANEL
ALT: 15 U/L (ref 0–53)
AST: 20 U/L (ref 0–37)
Albumin: 4.4 g/dL (ref 3.5–5.2)
Alkaline Phosphatase: 50 U/L (ref 39–117)
BUN: 13 mg/dL (ref 6–23)
CO2: 26 mEq/L (ref 19–32)
Calcium: 9.2 mg/dL (ref 8.4–10.5)
Chloride: 104 mEq/L (ref 96–112)
Creatinine, Ser: 1.02 mg/dL (ref 0.40–1.50)
GFR: 75.93 mL/min (ref >60.00)
Glucose, Bld: 95 mg/dL (ref 70–99)
Potassium: 4.1 mEq/L (ref 3.5–5.1)
Sodium: 138 mEq/L (ref 135–145)
Total Bilirubin: 0.5 mg/dL (ref 0.2–1.2)
Total Protein: 6.6 g/dL (ref 6.0–8.3)

## 2019-05-09 LAB — LIPID PANEL
Cholesterol: 229 mg/dL — ABNORMAL HIGH (ref 0–200)
HDL: 50.4 mg/dL (ref >39.00)
LDL Cholesterol: 149 mg/dL — ABNORMAL HIGH (ref 0–99)
NonHDL: 178.9
Total CHOL/HDL Ratio: 5
Triglycerides: 149 mg/dL (ref 0.0–149.0)
VLDL: 29.8 mg/dL (ref 0.0–40.0)

## 2019-05-09 LAB — CBC
HCT: 38.9 % — ABNORMAL LOW (ref 39.0–52.0)
Hemoglobin: 12.9 g/dL — ABNORMAL LOW (ref 13.0–17.0)
MCHC: 33.2 g/dL (ref 30.0–36.0)
MCV: 93.2 fl (ref 78.0–100.0)
Platelets: 247 10*3/uL (ref 150.0–400.0)
RBC: 4.17 Mil/uL — ABNORMAL LOW (ref 4.22–5.81)
RDW: 13.3 % (ref 11.5–15.5)
WBC: 4.2 10*3/uL (ref 4.0–10.5)

## 2019-05-09 LAB — TSH: TSH: 3.14 u[IU]/mL (ref 0.35–4.50)

## 2019-05-16 ENCOUNTER — Ambulatory Visit (INDEPENDENT_AMBULATORY_CARE_PROVIDER_SITE_OTHER): Payer: BC Managed Care – PPO | Admitting: Family Medicine

## 2019-05-16 ENCOUNTER — Other Ambulatory Visit: Payer: Self-pay

## 2019-05-16 VITALS — BP 112/72 | HR 68 | Temp 98.3°F | Resp 18 | Ht 68.0 in | Wt 169.0 lb

## 2019-05-16 DIAGNOSIS — Z0000 Encounter for general adult medical examination without abnormal findings: Secondary | ICD-10-CM | POA: Diagnosis not present

## 2019-05-16 DIAGNOSIS — G44009 Cluster headache syndrome, unspecified, not intractable: Secondary | ICD-10-CM | POA: Diagnosis not present

## 2019-05-16 DIAGNOSIS — K625 Hemorrhage of anus and rectum: Secondary | ICD-10-CM | POA: Diagnosis not present

## 2019-05-16 DIAGNOSIS — E785 Hyperlipidemia, unspecified: Secondary | ICD-10-CM | POA: Diagnosis not present

## 2019-05-16 DIAGNOSIS — D649 Anemia, unspecified: Secondary | ICD-10-CM | POA: Diagnosis not present

## 2019-05-16 DIAGNOSIS — Z91018 Allergy to other foods: Secondary | ICD-10-CM

## 2019-05-16 NOTE — Patient Instructions (Signed)
Preventive Care 40-54 Years Old, Male Preventive care refers to lifestyle choices and visits with your health care provider that can promote health and wellness. This includes:  A yearly physical exam. This is also called an annual well check.  Regular dental and eye exams.  Immunizations.  Screening for certain conditions.  Healthy lifestyle choices, such as eating a healthy diet, getting regular exercise, not using drugs or products that contain nicotine and tobacco, and limiting alcohol use. What can I expect for my preventive care visit? Physical exam Your health care provider will check:  Height and weight. These may be used to calculate body mass index (BMI), which is a measurement that tells if you are at a healthy weight.  Heart rate and blood pressure.  Your skin for abnormal spots. Counseling Your health care provider may ask you questions about:  Alcohol, tobacco, and drug use.  Emotional well-being.  Home and relationship well-being.  Sexual activity.  Eating habits.  Work and work environment. What immunizations do I need?  Influenza (flu) vaccine  This is recommended every year. Tetanus, diphtheria, and pertussis (Tdap) vaccine  You may need a Td booster every 10 years. Varicella (chickenpox) vaccine  You may need this vaccine if you have not already been vaccinated. Zoster (shingles) vaccine  You may need this after age 60. Measles, mumps, and rubella (MMR) vaccine  You may need at least one dose of MMR if you were born in 1957 or later. You may also need a second dose. Pneumococcal conjugate (PCV13) vaccine  You may need this if you have certain conditions and were not previously vaccinated. Pneumococcal polysaccharide (PPSV23) vaccine  You may need one or two doses if you smoke cigarettes or if you have certain conditions. Meningococcal conjugate (MenACWY) vaccine  You may need this if you have certain conditions. Hepatitis A vaccine   You may need this if you have certain conditions or if you travel or work in places where you may be exposed to hepatitis A. Hepatitis B vaccine  You may need this if you have certain conditions or if you travel or work in places where you may be exposed to hepatitis B. Haemophilus influenzae type b (Hib) vaccine  You may need this if you have certain risk factors. Human papillomavirus (HPV) vaccine  If recommended by your health care provider, you may need three doses over 6 months. You may receive vaccines as individual doses or as more than one vaccine together in one shot (combination vaccines). Talk with your health care provider about the risks and benefits of combination vaccines. What tests do I need? Blood tests  Lipid and cholesterol levels. These may be checked every 5 years, or more frequently if you are over 50 years old.  Hepatitis C test.  Hepatitis B test. Screening  Lung cancer screening. You may have this screening every year starting at age 54 if you have a 30-pack-year history of smoking and currently smoke or have quit within the past 15 years.  Prostate cancer screening. Recommendations will vary depending on your family history and other risks.  Colorectal cancer screening. All adults should have this screening starting at age 50 and continuing until age 54. Your health care provider may recommend screening at age 45 if you are at increased risk. You will have tests every 1-10 years, depending on your results and the type of screening test.  Diabetes screening. This is done by checking your blood sugar (glucose) after you have not eaten   for a while (fasting). You may have this done every 1-3 years.  Sexually transmitted disease (STD) testing. Follow these instructions at home: Eating and drinking  Eat a diet that includes fresh fruits and vegetables, whole grains, lean protein, and low-fat dairy products.  Take vitamin and mineral supplements as recommended  by your health care provider.  Do not drink alcohol if your health care provider tells you not to drink.  If you drink alcohol: ? Limit how much you have to 0-2 drinks a day. ? Be aware of how much alcohol is in your drink. In the [place] one drink equals one 12 oz bottle of beer (355 mL), one 5 oz glass of wine (148 mL), or one 1 oz glass of hard liquor (44 mL). Lifestyle  Take daily care of your teeth and gums.  Stay active. Exercise for at least 30 minutes on 5 or more days each week.  Do not use any products that contain nicotine or tobacco, such as cigarettes, e-cigarettes, and chewing tobacco. If you need help quitting, ask your health care provider.  If you are sexually active, practice safe sex. Use a condom or other form of protection to prevent STIs (sexually transmitted infections).  Talk with your health care provider about taking a low-dose aspirin every day starting at age 54. What's next?  Go to your health care provider once a year for a well check visit.  Ask your health care provider how often you should have your eyes and teeth checked.  Stay up to date on all vaccines. This information is not intended to replace advice given to you by your health care provider. Make sure you discuss any questions you have with your health care provider. Document Released: 08/02/2015 Document Revised: 06/30/2018 Document Reviewed: 06/30/2018 Elsevier Patient Education  2020 [employer].

## 2019-05-17 DIAGNOSIS — Z91018 Allergy to other foods: Secondary | ICD-10-CM | POA: Insufficient documentation

## 2019-05-17 DIAGNOSIS — D649 Anemia, unspecified: Secondary | ICD-10-CM | POA: Insufficient documentation

## 2019-05-17 NOTE — Assessment & Plan Note (Signed)
He avoids all gluten and corn and he feels much better when he avoids them.

## 2019-05-17 NOTE — Assessment & Plan Note (Signed)
Patient encouraged to maintain heart healthy diet, regular exercise, adequate sleep. Consider daily probiotics. Take medications as prescribed. Decliens flu shot. Colonoscopy due in 2021. Labs ordered and reviewed.

## 2019-05-17 NOTE — Assessment & Plan Note (Signed)
No recent flares 

## 2019-05-17 NOTE — Assessment & Plan Note (Signed)
Encouraged heart healthy diet, increase exercise, avoid trans fats, consider a krill oil cap daily 

## 2019-05-17 NOTE — Progress Notes (Addendum)
Subjective:    Patient ID: Marc Jenkins, male    DOB: April 09, 1966, 53 y.o.   MRN: [id number]  No chief complaint on file.   HPI Patient is in today for annual preventative exam and follow up on chronic medical concerns including dysplipidemia and headaches. He feels well today. 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 He is eating well and staying active as a result. His headaches are better. He avoids corn and gluten and he feels much better. Denies CP/palp/SOB/HA/congestion/fevers/GI or GU c/o. Taking meds as prescribed  Past Medical History:  Diagnosis Date  . Allergy   . Chicken pox as a child  . Depression   . FH: colon cancer 05/04/2012  . H/O measles   . H/O mumps   . Headache, cluster   . Hyperlipidemia 05/05/2011  . Left hand pain 05/04/2012  . Measles as a child  . Mumps as a child  . Muscle cramp 11/12/2014  . Preventative health care 05/05/2011    Past Surgical History:  Procedure Laterality Date  . ACHILLES TENDON SURGERY  2-11  . TONSILLECTOMY AND ADENOIDECTOMY      Family History  Problem Relation Age of Onset  . Ulcers Mother        in colon  . Hyperlipidemia Mother   . Colon cancer Father 67  . Hyperlipidemia Sister   . Diabetes Sister        type 2/type 2  . Obesity Sister   . Diabetes Sister   . Hyperlipidemia Sister   . Obesity Sister     Social History   Socioeconomic History  . Marital status: Married    Spouse name: Not on file  . Number of children: Not on file  . Years of education: Not on file  . Highest education level: Not on file  Occupational History  . Not on file  Social Needs  . Financial resource strain: Not on file  . Food insecurity    Worry: Not on file    Inability: Not on file  . Transportation needs    Medical: Not on file    Non-medical: Not on file  Tobacco Use  . Smoking status: Never Smoker  . Smokeless tobacco: Never Used  Substance and  Sexual Activity  . Alcohol use: Yes    Comment: 2 glass of wine or a beer nightly  . Drug use: No  . Sexual activity: Yes    Partners: Female  Lifestyle  . Physical activity    Days per week: Not on file    Minutes per session: Not on file  . Stress: Not on file  Relationships  . Social Herbalist on phone: Not on file    Gets together: Not on file    Attends religious service: Not on file    Active member of club or organization: Not on file    Attends meetings of clubs or organizations: Not on file    Relationship status: Not on file  . Intimate partner violence    Fear of current or ex partner: Not on file    Emotionally abused: Not on file    Physically abused: Not on file    Forced sexual activity: Not on file  Other Topics Concern  . Not on file  Social History Narrative  . Not on file    Outpatient Medications Prior to Visit  Medication Sig  Dispense Refill  . Cholecalciferol (VITAMIN D3) 5000 UNITS TABS Take by mouth.    . COD LIVER OIL PO Take by mouth.    . Multiple Vitamin (MULTIVITAMIN) capsule Take 1 capsule by mouth daily.    . NON FORMULARY MTHR=B 6- 2 daily    . OVER THE COUNTER MEDICATION Chloroplex- 2 daily     No facility-administered medications prior to visit.     No Known Allergies  Review of Systems  Constitutional: Negative for chills, fever and malaise/fatigue.  HENT: Negative for congestion and hearing loss.   Eyes: Negative for discharge.  Respiratory: Negative for cough, sputum production and shortness of breath.   Cardiovascular: Negative for chest pain, palpitations and leg swelling.  Gastrointestinal: Negative for abdominal pain, blood in stool, constipation, diarrhea, heartburn, nausea and vomiting.  Genitourinary: Negative for dysuria, frequency, hematuria and urgency.  Musculoskeletal: Negative for back pain, falls and myalgias.  Skin: Negative for rash.  Neurological: Negative for dizziness, sensory change, loss of  consciousness, weakness and headaches.  Endo/Heme/Allergies: Negative for environmental allergies. Does not bruise/bleed easily.  Psychiatric/Behavioral: Negative for depression and suicidal ideas. The patient is not nervous/anxious and does not have insomnia.        Objective:    Physical Exam Vitals signs and nursing note reviewed.  Constitutional:      General: He is not in acute distress.    Appearance: He is well-developed. He is not ill-appearing.  HENT:     Head: Normocephalic and atraumatic.     Right Ear: Tympanic membrane, ear canal and external ear normal. There is no impacted cerumen.     Left Ear: Tympanic membrane, ear canal and external ear normal. There is no impacted cerumen.     Nose: Nose normal.  Eyes:     General:        Right eye: No discharge.        Left eye: No discharge.  Neck:     Musculoskeletal: Normal range of motion and neck supple.  Cardiovascular:     Rate and Rhythm: Normal rate and regular rhythm.     Heart sounds: Normal heart sounds. No murmur.  Pulmonary:     Effort: Pulmonary effort is normal.     Breath sounds: Normal breath sounds. No wheezing.  Abdominal:     General: Bowel sounds are normal.     Palpations: Abdomen is soft. There is no mass.     Tenderness: There is no abdominal tenderness.  Musculoskeletal: Normal range of motion.     Right lower leg: No edema.     Left lower leg: No edema.  Skin:    General: Skin is warm and dry.  Neurological:     Mental Status: He is alert and oriented to person, place, and time.     Cranial Nerves: No cranial nerve deficit.     Deep Tendon Reflexes: Reflexes normal.  Psychiatric:        Mood and Affect: Mood normal.        Behavior: Behavior normal.     BP 112/72 (BP Location: Left Arm, Patient Position: Sitting, Cuff Size: Normal)   Pulse 68   Temp 98.3 F (36.8 C) (Temporal)   Resp 18   Ht 5\' 8"  (1.727 m)   Wt 169 lb (76.7 kg)   SpO2 95%   BMI 25.70 kg/m  Wt Readings from  Last 3 Encounters:  05/16/19 169 lb (76.7 kg)  04/26/18 161 lb 6.4 oz (73.2 kg)  01/18/17 169 lb (76.7 kg)    Diabetic Foot Exam - Simple   No data filed     Lab Results  Component Value Date   WBC 4.2 05/09/2019   HGB 12.9 (L) 05/09/2019   HCT 38.9 (L) 05/09/2019   PLT 247.0 05/09/2019   GLUCOSE 95 05/09/2019   CHOL 229 (H) 05/09/2019   TRIG 149.0 05/09/2019   HDL 50.40 05/09/2019   LDLDIRECT 201.4 05/04/2012   LDLCALC 149 (H) 05/09/2019   ALT 15 05/09/2019   AST 20 05/09/2019   NA 138 05/09/2019   K 4.1 05/09/2019   CL 104 05/09/2019   CREATININE 1.02 05/09/2019   BUN 13 05/09/2019   CO2 26 05/09/2019   TSH 3.14 05/09/2019   PSA 1.55 01/18/2017    Lab Results  Component Value Date   TSH 3.14 05/09/2019   Lab Results  Component Value Date   WBC 4.2 05/09/2019   HGB 12.9 (L) 05/09/2019   HCT 38.9 (L) 05/09/2019   MCV 93.2 05/09/2019   PLT 247.0 05/09/2019   Lab Results  Component Value Date   NA 138 05/09/2019   K 4.1 05/09/2019   CO2 26 05/09/2019   GLUCOSE 95 05/09/2019   BUN 13 05/09/2019   CREATININE 1.02 05/09/2019   BILITOT 0.5 05/09/2019   ALKPHOS 50 05/09/2019   AST 20 05/09/2019   ALT 15 05/09/2019   PROT 6.6 05/09/2019   ALBUMIN 4.4 05/09/2019   CALCIUM 9.2 05/09/2019   GFR 75.93 05/09/2019   Lab Results  Component Value Date   CHOL 229 (H) 05/09/2019   Lab Results  Component Value Date   HDL 50.40 05/09/2019   Lab Results  Component Value Date   LDLCALC 149 (H) 05/09/2019   Lab Results  Component Value Date   TRIG 149.0 05/09/2019   Lab Results  Component Value Date   CHOLHDL 5 05/09/2019   No results found for: HGBA1C     Assessment & Plan:   Problem List Items Addressed This Visit    Cluster headache syndrome    No recent flares.      Hyperlipidemia    Encouraged heart healthy diet, increase exercise, avoid trans fats, consider a krill oil cap daily      Relevant Orders   Lipid panel   Preventative health  care    Patient encouraged to maintain heart healthy diet, regular exercise, adequate sleep. Consider daily probiotics. Take medications as prescribed. Decliens flu shot. Colonoscopy due in 2021. Labs ordered and reviewed.       Anemia    Mild. Check hemoccult cards and repeat CBC in 3 months. His next colonoscopy is due in 2021and he has no bowel concerns. Will continue to monitor      Multiple food allergies    He avoids all gluten and corn and he feels much better when he avoids them.       Other Visit Diagnoses    Rectal bleeding    -  Primary   Relevant Orders   Fecal occult blood, imunochemical([facility])   CBC   Comprehensive metabolic panel    Rectal bleeding was not endorsed at visit and should be disregarded as a visit diagnosis, only persistent anemia  I am having Aaron Mose. Nicole Kindred "Dave" maintain his COD LIVER OIL PO, multivitamin, OVER THE COUNTER MEDICATION, NON FORMULARY, and Vitamin D3.  No orders of the defined types were placed in this encounter.    Penni Homans, MD

## 2019-05-17 NOTE — Assessment & Plan Note (Addendum)
Mild. Check hemoccult cards and repeat CBC in 3 months. His next colonoscopy is due in 2021and he has no bowel concerns. Will continue to monitor

## 2019-05-31 ENCOUNTER — Other Ambulatory Visit (INDEPENDENT_AMBULATORY_CARE_PROVIDER_SITE_OTHER): Payer: BC Managed Care – PPO

## 2019-05-31 DIAGNOSIS — K625 Hemorrhage of anus and rectum: Secondary | ICD-10-CM | POA: Diagnosis not present

## 2019-05-31 LAB — FECAL OCCULT BLOOD, IMMUNOCHEMICAL: Fecal Occult Bld: NEGATIVE

## 2019-06-01 ENCOUNTER — Encounter: Payer: Self-pay | Admitting: Family Medicine

## 2019-06-04 ENCOUNTER — Encounter: Payer: Self-pay | Admitting: Family Medicine

## 2019-06-13 DIAGNOSIS — Z20828 Contact with and (suspected) exposure to other viral communicable diseases: Secondary | ICD-10-CM | POA: Diagnosis not present

## 2019-06-15 ENCOUNTER — Encounter: Payer: Self-pay | Admitting: Family Medicine

## 2019-07-05 DIAGNOSIS — L821 Other seborrheic keratosis: Secondary | ICD-10-CM | POA: Diagnosis not present

## 2019-07-05 DIAGNOSIS — D2271 Melanocytic nevi of right lower limb, including hip: Secondary | ICD-10-CM | POA: Diagnosis not present

## 2019-07-05 DIAGNOSIS — D225 Melanocytic nevi of trunk: Secondary | ICD-10-CM | POA: Diagnosis not present

## 2019-07-05 DIAGNOSIS — L814 Other melanin hyperpigmentation: Secondary | ICD-10-CM | POA: Diagnosis not present

## 2019-07-13 ENCOUNTER — Encounter: Payer: Self-pay | Admitting: Family Medicine

## 2019-07-31 ENCOUNTER — Encounter: Payer: Self-pay | Admitting: Family Medicine

## 2019-08-03 ENCOUNTER — Other Ambulatory Visit: Payer: Self-pay | Admitting: Family Medicine

## 2019-08-03 DIAGNOSIS — Z9189 Other specified personal risk factors, not elsewhere classified: Secondary | ICD-10-CM

## 2019-08-03 NOTE — Telephone Encounter (Signed)
Dr Charlett Blake / Princess -- I have pended the COVID order but wasn't sure what DX to use?

## 2019-08-15 NOTE — Telephone Encounter (Signed)
Pt is scheduled for lab appt in our office tomorrow (1/27). He did not respond to previous mychart message clarifying if he wanted to get all labs completed at the [facility] lab. Attempted to verify lab location with pt and left detailed message on voicemail to call and verify lab appt at [facility] tomorrow. All lab orders location changed to [facility] at PCP office.

## 2019-08-15 NOTE — Addendum Note (Signed)
Addended by: Kelle Darting A on: 08/15/2019 04:26 PM   Modules accepted: Orders

## 2019-08-16 ENCOUNTER — Other Ambulatory Visit (INDEPENDENT_AMBULATORY_CARE_PROVIDER_SITE_OTHER): Payer: BC Managed Care – PPO

## 2019-08-16 ENCOUNTER — Other Ambulatory Visit: Payer: Self-pay

## 2019-08-16 DIAGNOSIS — E785 Hyperlipidemia, unspecified: Secondary | ICD-10-CM | POA: Diagnosis not present

## 2019-08-16 DIAGNOSIS — K625 Hemorrhage of anus and rectum: Secondary | ICD-10-CM

## 2019-08-16 LAB — COMPREHENSIVE METABOLIC PANEL
ALT: 17 U/L (ref 0–53)
AST: 17 U/L (ref 0–37)
Albumin: 4.2 g/dL (ref 3.5–5.2)
Alkaline Phosphatase: 54 U/L (ref 39–117)
BUN: 12 mg/dL (ref 6–23)
CO2: 26 mEq/L (ref 19–32)
Calcium: 9.1 mg/dL (ref 8.4–10.5)
Chloride: 107 mEq/L (ref 96–112)
Creatinine, Ser: 1 mg/dL (ref 0.40–1.50)
GFR: 77.61 mL/min (ref >60.00)
Glucose, Bld: 83 mg/dL (ref 70–99)
Potassium: 4.2 mEq/L (ref 3.5–5.1)
Sodium: 141 mEq/L (ref 135–145)
Total Bilirubin: 0.4 mg/dL (ref 0.2–1.2)
Total Protein: 6.4 g/dL (ref 6.0–8.3)

## 2019-08-16 LAB — CBC
HCT: 38.7 % — ABNORMAL LOW (ref 39.0–52.0)
Hemoglobin: 12.8 g/dL — ABNORMAL LOW (ref 13.0–17.0)
MCHC: 33.1 g/dL (ref 30.0–36.0)
MCV: 93.2 fl (ref 78.0–100.0)
Platelets: 254 10*3/uL (ref 150.0–400.0)
RBC: 4.15 Mil/uL — ABNORMAL LOW (ref 4.22–5.81)
RDW: 13.8 % (ref 11.5–15.5)
WBC: 4.4 10*3/uL (ref 4.0–10.5)

## 2019-08-16 LAB — LIPID PANEL
Cholesterol: 218 mg/dL — ABNORMAL HIGH (ref 0–200)
HDL: 45.2 mg/dL (ref >39.00)
LDL Cholesterol: 145 mg/dL — ABNORMAL HIGH (ref 0–99)
NonHDL: 172.93
Total CHOL/HDL Ratio: 5
Triglycerides: 141 mg/dL (ref 0.0–149.0)
VLDL: 28.2 mg/dL (ref 0.0–40.0)

## 2019-08-25 ENCOUNTER — Encounter: Payer: Self-pay | Admitting: Family Medicine

## 2019-09-04 ENCOUNTER — Encounter: Payer: Self-pay | Admitting: Family Medicine

## 2019-09-05 ENCOUNTER — Other Ambulatory Visit: Payer: Self-pay

## 2019-09-05 NOTE — Addendum Note (Signed)
Addended by: Jiles Prows on: 09/05/2019 09:44 AM   Modules accepted: Orders

## 2019-09-12 DIAGNOSIS — R5383 Other fatigue: Secondary | ICD-10-CM | POA: Diagnosis not present

## 2019-09-12 DIAGNOSIS — F82 Specific developmental disorder of motor function: Secondary | ICD-10-CM | POA: Diagnosis not present

## 2019-09-12 DIAGNOSIS — G4700 Insomnia, unspecified: Secondary | ICD-10-CM | POA: Diagnosis not present

## 2019-09-12 DIAGNOSIS — G43009 Migraine without aura, not intractable, without status migrainosus: Secondary | ICD-10-CM | POA: Diagnosis not present

## 2019-10-02 ENCOUNTER — Encounter: Payer: Self-pay | Admitting: Family Medicine

## 2019-10-23 ENCOUNTER — Encounter: Payer: Self-pay | Admitting: Family Medicine

## 2020-01-08 ENCOUNTER — Encounter: Payer: Self-pay | Admitting: Family Medicine

## 2020-04-30 ENCOUNTER — Encounter: Payer: Self-pay | Admitting: Internal Medicine

## 2020-05-14 ENCOUNTER — Encounter: Payer: Self-pay | Admitting: Family Medicine

## 2020-05-16 ENCOUNTER — Ambulatory Visit (INDEPENDENT_AMBULATORY_CARE_PROVIDER_SITE_OTHER): Payer: BC Managed Care – PPO | Admitting: Medical

## 2020-05-16 ENCOUNTER — Other Ambulatory Visit: Payer: Self-pay

## 2020-05-16 ENCOUNTER — Encounter: Payer: BC Managed Care – PPO | Admitting: Family Medicine

## 2020-05-16 ENCOUNTER — Encounter: Payer: Self-pay | Admitting: Medical

## 2020-05-16 VITALS — BP 126/76 | HR 66 | Temp 98.0°F | Resp 16 | Ht 68.5 in | Wt 168.8 lb

## 2020-05-16 DIAGNOSIS — Z0000 Encounter for general adult medical examination without abnormal findings: Secondary | ICD-10-CM

## 2020-05-16 DIAGNOSIS — Z125 Encounter for screening for malignant neoplasm of prostate: Secondary | ICD-10-CM

## 2020-05-16 NOTE — Progress Notes (Signed)
Subjective:    Patient ID: Marc Jenkins, male    DOB: June 27, 1966, 54 y.o.   MRN: [id number]  HPI  Pt in for cpe/wellness.  Fasting since midnight. Pt works out 3 times a week. Weights and cardio. Hiking as well. Pt states diet healthy. States gluten intolerant. Eats sugar free diet. Non smoker. No sodas.  Pt had covid vaccine back in spring.  Pt declines flu vaccine.  Pt has upcoming colonoscopy.    Review of Systems  Constitutional: Negative for chills, fatigue and fever.  Respiratory: Negative for cough, chest tightness, shortness of breath and wheezing.   Cardiovascular: Negative for chest pain and palpitations.  Gastrointestinal: Negative for abdominal pain, blood in stool, nausea and vomiting.  Genitourinary: Negative for dysuria, testicular pain and urgency.  Musculoskeletal: Negative for back pain.  Skin: Negative for rash.  Neurological: Negative for dizziness, light-headedness and headaches.  Hematological: Negative for adenopathy. Does not bruise/bleed easily.  Psychiatric/Behavioral: Negative for behavioral problems and confusion.    Past Medical History:  Diagnosis Date  . Allergy   . Chicken pox as a child  . Depression   . FH: colon cancer 05/04/2012  . H/O measles   . H/O mumps   . Headache, cluster   . Hyperlipidemia 05/05/2011  . Left hand pain 05/04/2012  . Measles as a child  . Mumps as a child  . Muscle cramp 11/12/2014  . Preventative health care 05/05/2011     Social History   Socioeconomic History  . Marital status: Married    Spouse name: Not on file  . Number of children: Not on file  . Years of education: Not on file  . Highest education level: Not on file  Occupational History  . Not on file  Tobacco Use  . Smoking status: Never Smoker  . Smokeless tobacco: Never Used  Vaping Use  . Vaping Use: Never used  Substance and Sexual Activity  . Alcohol use: Yes    Comment: 2 glass of wine or a beer nightly  . Drug use: No    . Sexual activity: Yes    Partners: Female  Other Topics Concern  . Not on file  Social History Narrative  . Not on file   Social Determinants of Health   Financial Resource Strain:   . Difficulty of Paying Living Expenses: Not on file  Food Insecurity:   . Worried About Charity fundraiser in the Last Year: Not on file  . Ran Out of Food in the Last Year: Not on file  Transportation Needs:   . Lack of Transportation (Medical): Not on file  . Lack of Transportation (Non-Medical): Not on file  Physical Activity:   . Days of Exercise per Week: Not on file  . Minutes of Exercise per Session: Not on file  Stress:   . Feeling of Stress : Not on file  Social Connections:   . Frequency of Communication with Friends and Family: Not on file  . Frequency of Social Gatherings with Friends and Family: Not on file  . Attends Religious Services: Not on file  . Active Member of Clubs or Organizations: Not on file  . Attends Archivist Meetings: Not on file  . Marital Status: Not on file  Intimate Partner Violence:   . Fear of Current or Ex-Partner: Not on file  . Emotionally Abused: Not on file  . Physically Abused: Not on file  . Sexually Abused: Not on file  Past Surgical History:  Procedure Laterality Date  . ACHILLES TENDON SURGERY  2-11  . TONSILLECTOMY AND ADENOIDECTOMY      Family History  Problem Relation Age of Onset  . Ulcers Mother        in colon  . Hyperlipidemia Mother   . Colon cancer Father 37  . Hyperlipidemia Sister   . Diabetes Sister        type 2/type 2  . Obesity Sister   . Diabetes Sister   . Hyperlipidemia Sister   . Obesity Sister     No Known Allergies  Current Outpatient Medications on File Prior to Visit  Medication Sig Dispense Refill  . Cholecalciferol (VITAMIN D3) 5000 UNITS TABS Take by mouth.    . COD LIVER OIL PO Take by mouth.    . Multiple Vitamin (MULTIVITAMIN) capsule Take 1 capsule by mouth daily.    . NON FORMULARY  MTHR=B 6- 2 daily    . OVER THE COUNTER MEDICATION Chloroplex- 2 daily     No current facility-administered medications on file prior to visit.    BP 126/76 (BP Location: Left Arm)   Pulse 66   Temp 98 F (36.7 C)   Resp 16   Ht 5' 8.5" (1.74 m)   Wt 168 lb 12.8 oz (76.6 kg)   SpO2 99%   BMI 25.29 kg/m       Objective:   Physical Exam  General Mental Status- Alert. General Appearance- Not in acute distress.   Skin General: Color- Normal Color. Moisture- Normal Moisture.  Neck Carotid Arteries- Normal color. Moisture- Normal Moisture. No carotid bruits. No JVD.  Chest and Lung Exam Auscultation: Breath Sounds:-Normal.  Cardiovascular Auscultation:Rythm- Regular. Murmurs & Other Heart Sounds:Auscultation of the heart reveals- No Murmurs.  Abdomen Inspection:-Inspeection Normal. Palpation/Percussion:Note:No mass. Palpation and Percussion of the abdomen reveal- Non Tender, Non Distended + BS, no rebound or guarding.   Neurologic Cranial Nerve exam:- CN III-XII intact(No nystagmus), symmetric smile. Strength:- 5/5 equal and symmetric strength both upper and lower extremities.      Assessment & Plan:  For you wellness exam today I have ordered cbc, cmp,lipid panel and psa.  Vaccine up to date.  Recommend  continue to exercise and eat  healthy diet.  We will let you know lab results as they come in.  Follow up date appointment will be determined after lab review.  Mackie Pai, PA-C

## 2020-05-16 NOTE — Patient Instructions (Addendum)
For you wellness exam today I have ordered cbc, cmp,lipid panel and psa.  Vaccine up to date.  Recommend continue to exercise and eat  healthy diet.  We will let you know lab results as they come in.  Follow up date appointment will be determined after lab review.    Preventive Care 71-55 Years Old, Male Preventive care refers to lifestyle choices and visits with your health care provider that can promote health and wellness. This includes:  A yearly physical exam. This is also called an annual well check.  Regular dental and eye exams.  Immunizations.  Screening for certain conditions.  Healthy lifestyle choices, such as eating a healthy diet, getting regular exercise, not using drugs or products that contain nicotine and tobacco, and limiting alcohol use. What can I expect for my preventive care visit? Physical exam Your health care provider will check:  Height and weight. These may be used to calculate body mass index (BMI), which is a measurement that tells if you are at a healthy weight.  Heart rate and blood pressure.  Your skin for abnormal spots. Counseling Your health care provider may ask you questions about:  Alcohol, tobacco, and drug use.  Emotional well-being.  Home and relationship well-being.  Sexual activity.  Eating habits.  Work and work Statistician. What immunizations do I need?  Influenza (flu) vaccine  This is recommended every year. Tetanus, diphtheria, and pertussis (Tdap) vaccine  You may need a Td booster every 10 years. Varicella (chickenpox) vaccine  You may need this vaccine if you have not already been vaccinated. Zoster (shingles) vaccine  You may need this after age 57. Measles, mumps, and rubella (MMR) vaccine  You may need at least one dose of MMR if you were born in 1957 or later. You may also need a second dose. Pneumococcal conjugate (PCV13) vaccine  You may need this if you have certain conditions and were not  previously vaccinated. Pneumococcal polysaccharide (PPSV23) vaccine  You may need one or two doses if you smoke cigarettes or if you have certain conditions. Meningococcal conjugate (MenACWY) vaccine  You may need this if you have certain conditions. Hepatitis A vaccine  You may need this if you have certain conditions or if you travel or work in places where you may be exposed to hepatitis A. Hepatitis B vaccine  You may need this if you have certain conditions or if you travel or work in places where you may be exposed to hepatitis B. Haemophilus influenzae type b (Hib) vaccine  You may need this if you have certain risk factors. Human papillomavirus (HPV) vaccine  If recommended by your health care provider, you may need three doses over 6 months. You may receive vaccines as individual doses or as more than one vaccine together in one shot (combination vaccines). Talk with your health care provider about the risks and benefits of combination vaccines. What tests do I need? Blood tests  Lipid and cholesterol levels. These may be checked every 5 years, or more frequently if you are over 50 years old.  Hepatitis C test.  Hepatitis B test. Screening  Lung cancer screening. You may have this screening every year starting at age 64 if you have a 30-pack-year history of smoking and currently smoke or have quit within the past 15 years.  Prostate cancer screening. Recommendations will vary depending on your family history and other risks.  Colorectal cancer screening. All adults should have this screening starting at age 71 and  continuing until age 36. Your health care provider may recommend screening at age 69 if you are at increased risk. You will have tests every 1-10 years, depending on your results and the type of screening test.  Diabetes screening. This is done by checking your blood sugar (glucose) after you have not eaten for a while (fasting). You may have this done every 1-3  years.  Sexually transmitted disease (STD) testing. Follow these instructions at home: Eating and drinking  Eat a diet that includes fresh fruits and vegetables, whole grains, lean protein, and low-fat dairy products.  Take vitamin and mineral supplements as recommended by your health care provider.  Do not drink alcohol if your health care provider tells you not to drink.  If you drink alcohol: ? Limit how much you have to 0-2 drinks a day. ? Be aware of how much alcohol is in your drink. In the [place] one drink equals one 12 oz bottle of beer (355 mL), one 5 oz glass of wine (148 mL), or one 1 oz glass of hard liquor (44 mL). Lifestyle  Take daily care of your teeth and gums.  Stay active. Exercise for at least 30 minutes on 5 or more days each week.  Do not use any products that contain nicotine or tobacco, such as cigarettes, e-cigarettes, and chewing tobacco. If you need help quitting, ask your health care provider.  If you are sexually active, practice safe sex. Use a condom or other form of protection to prevent STIs (sexually transmitted infections).  Talk with your health care provider about taking a low-dose aspirin every day starting at age 11. What's next?  Go to your health care provider once a year for a well check visit.  Ask your health care provider how often you should have your eyes and teeth checked.  Stay up to date on all vaccines. This information is not intended to replace advice given to you by your health care provider. Make sure you discuss any questions you have with your health care provider. Document Revised: 06/30/2018 Document Reviewed: 06/30/2018 Elsevier Patient Education  2020 [employer].

## 2020-05-17 LAB — CBC WITH DIFFERENTIAL/PLATELET
Absolute Monocytes: 550 cells/uL (ref 200–950)
Basophils Absolute: 22 cells/uL (ref 0–200)
Basophils Relative: 0.5 %
Eosinophils Absolute: 39 cells/uL (ref 15–500)
Eosinophils Relative: 0.9 %
HCT: 39.1 % (ref 38.5–50.0)
Hemoglobin: 13.3 g/dL (ref 13.2–17.1)
Lymphs Abs: 1608 cells/uL (ref 850–3900)
MCH: 31.9 pg (ref 27.0–33.0)
MCHC: 34 g/dL (ref 32.0–36.0)
MCV: 93.8 fL (ref 80.0–100.0)
MPV: 10.2 fL (ref 7.5–12.5)
Monocytes Relative: 12.8 %
Neutro Abs: 2081 cells/uL (ref 1500–7800)
Neutrophils Relative %: 48.4 %
Platelets: 254 10*3/uL (ref 140–400)
RBC: 4.17 10*6/uL — ABNORMAL LOW (ref 4.20–5.80)
RDW: 12.7 % (ref 11.0–15.0)
Total Lymphocyte: 37.4 %
WBC: 4.3 10*3/uL (ref 3.8–10.8)

## 2020-05-17 LAB — LIPID PANEL
Cholesterol: 277 mg/dL — ABNORMAL HIGH (ref <200)
HDL: 57 mg/dL (ref >=40)
LDL Cholesterol (Calc): 193 mg/dL (calc) — ABNORMAL HIGH
Non-HDL Cholesterol (Calc): 220 mg/dL (calc) — ABNORMAL HIGH (ref <130)
Total CHOL/HDL Ratio: 4.9 (calc) (ref <5.0)
Triglycerides: 129 mg/dL (ref <150)

## 2020-05-17 LAB — COMPREHENSIVE METABOLIC PANEL
AG Ratio: 2 (calc) (ref 1.0–2.5)
ALT: 13 U/L (ref 9–46)
AST: 19 U/L (ref 10–35)
Albumin: 4.5 g/dL (ref 3.6–5.1)
Alkaline phosphatase (APISO): 53 U/L (ref 35–144)
BUN: 17 mg/dL (ref 7–25)
CO2: 23 mmol/L (ref 20–32)
Calcium: 9.5 mg/dL (ref 8.6–10.3)
Chloride: 105 mmol/L (ref 98–110)
Creat: 1.03 mg/dL (ref 0.70–1.33)
Globulin: 2.2 g/dL (calc) (ref 1.9–3.7)
Glucose, Bld: 90 mg/dL (ref 65–99)
Potassium: 4.3 mmol/L (ref 3.5–5.3)
Sodium: 139 mmol/L (ref 135–146)
Total Bilirubin: 0.5 mg/dL (ref 0.2–1.2)
Total Protein: 6.7 g/dL (ref 6.1–8.1)

## 2020-05-17 LAB — PSA: PSA: 1.27 ng/mL (ref <=4.0)

## 2020-06-11 ENCOUNTER — Ambulatory Visit (AMBULATORY_SURGERY_CENTER): Payer: Self-pay | Admitting: *Deleted

## 2020-06-11 ENCOUNTER — Other Ambulatory Visit: Payer: Self-pay

## 2020-06-11 VITALS — Ht 67.0 in | Wt 170.2 lb

## 2020-06-11 DIAGNOSIS — Z86010 Personal history of colonic polyps: Secondary | ICD-10-CM

## 2020-06-11 MED ORDER — SUPREP BOWEL PREP KIT 17.5-3.13-1.6 GM/177ML PO SOLN: 1.0000 | Freq: Once | ORAL | 0 refills | Status: AC

## 2020-06-11 NOTE — Progress Notes (Signed)
Patient denies any allergies to egg or soy products. Patient denies complications with anesthesia/sedation.  Patient denies oxygen use at home and denies diet medications. Patient is fully vaccinated with [employer].vaccine.

## 2020-06-18 ENCOUNTER — Encounter: Payer: Self-pay | Admitting: Internal Medicine

## 2020-06-24 ENCOUNTER — Encounter: Payer: Self-pay | Admitting: Internal Medicine

## 2020-06-24 ENCOUNTER — Other Ambulatory Visit: Payer: Self-pay

## 2020-06-24 ENCOUNTER — Ambulatory Visit (AMBULATORY_SURGERY_CENTER): Payer: BC Managed Care – PPO | Admitting: Internal Medicine

## 2020-06-24 VITALS — BP 110/74 | HR 59 | Temp 97.3°F | Resp 16 | Ht 67.0 in | Wt 170.0 lb

## 2020-06-24 DIAGNOSIS — Z800 Family history of malignant neoplasm of digestive organs: Secondary | ICD-10-CM | POA: Diagnosis not present

## 2020-06-24 DIAGNOSIS — Z1211 Encounter for screening for malignant neoplasm of colon: Secondary | ICD-10-CM | POA: Diagnosis not present

## 2020-06-24 DIAGNOSIS — Z86010 Personal history of colonic polyps: Secondary | ICD-10-CM

## 2020-06-24 MED ORDER — SODIUM CHLORIDE 0.9 % IV SOLN: 500.0000 mL | Freq: Once | INTRAVENOUS | Status: DC

## 2020-06-24 NOTE — Op Note (Signed)
[facility] Patient Name: Marc Jenkins Procedure Date: 06/24/2020 8:29 AM MRN: [id number] Endoscopist: Jerene Bears , MD Age: 54 Referring MD:  Date of Birth: 01/15/1966 Gender: Male Account #: [id number] Procedure:                Colonoscopy Indications:              High risk colon cancer surveillance: Personal                            history of sessile serrated colon polyp (less than                            10 mm in size) with no dysplasia, Family history of                            colon cancer in a first-degree relative before age                            15 years, Last colonoscopy: June 2016 Medicines:                Monitored Anesthesia Care Procedure:                Pre-Anesthesia Assessment:                           - Prior to the procedure, a History and Physical                            was performed, and patient medications and                            allergies were reviewed. The patient's tolerance of                            previous anesthesia was also reviewed. The risks                            and benefits of the procedure and the sedation                            options and risks were discussed with the patient.                            All questions were answered, and informed consent                            was obtained. Prior Anticoagulants: The patient has                            taken no previous anticoagulant or antiplatelet                            agents. ASA Grade Assessment: II - A patient with  mild systemic disease. After reviewing the risks                            and benefits, the patient was deemed in                            satisfactory condition to undergo the procedure.                           After obtaining informed consent, the colonoscope                            was passed under direct vision. Throughout the                            procedure, the patient's blood  pressure, pulse, and                            oxygen saturations were monitored continuously. The                            Colonoscope was introduced through the anus and                            advanced to the cecum, identified by appendiceal                            orifice and ileocecal valve. The colonoscopy was                            performed without difficulty. The patient tolerated                            the procedure well. The quality of the bowel                            preparation was good. The ileocecal valve,                            appendiceal orifice, and rectum were photographed. Scope In: 8:39:06 AM Scope Out: 8:54:31 AM Scope Withdrawal Time: 0 hours 13 minutes 1 second  Total Procedure Duration: 0 hours 15 minutes 25 seconds  Findings:                 The digital rectal exam was normal.                           A few small-mouthed diverticula were found in the                            sigmoid colon and hepatic flexure.                           Internal hemorrhoids were found during  retroflexion. The hemorrhoids were small.                           The exam was otherwise without abnormality. Complications:            No immediate complications. Estimated Blood Loss:     Estimated blood loss: none. Impression:               - Diverticulosis in the sigmoid colon and at the                            hepatic flexure.                           - Small internal hemorrhoids.                           - The examination was otherwise normal.                           - No specimens collected. Recommendation:           - Patient has a contact number available for                            emergencies. The signs and symptoms of potential                            delayed complications were discussed with the                            patient. Return to normal activities tomorrow.                            Written  discharge instructions were provided to the                            patient.                           - Resume previous diet.                           - Continue present medications.                           - Await pathology results.                           - Repeat colonoscopy in 5 years for surveillance. Jerene Bears, MD 06/24/2020 8:57:28 AM This report has been signed electronically.

## 2020-06-24 NOTE — Patient Instructions (Signed)
Handouts Provided:  Diverticulosis ? ?YOU HAD AN ENDOSCOPIC PROCEDURE TODAY AT THE [facility]:   Refer to the procedure report that was given to you for any specific questions about what was found during the examination.  If the procedure report does not answer your questions, please call your gastroenterologist to clarify.  If you requested that your care partner not be given the details of your procedure findings, then the procedure report has been included in a sealed envelope for you to review at your convenience later. ? ?YOU SHOULD EXPECT: Some feelings of bloating in the abdomen. Passage of more gas than usual.  Walking can help get rid of the air that was put into your GI tract during the procedure and reduce the bloating. If you had a lower endoscopy (such as a colonoscopy or flexible sigmoidoscopy) you may notice spotting of blood in your stool or on the toilet paper. If you underwent a bowel prep for your procedure, you may not have a normal bowel movement for a few days. ? ?Please Note:  You might notice some irritation and congestion in your nose or some drainage.  This is from the oxygen used during your procedure.  There is no need for concern and it should clear up in a day or so. ? ?SYMPTOMS TO REPORT IMMEDIATELY: ? ?Following lower endoscopy (colonoscopy or flexible sigmoidoscopy): ? Excessive amounts of blood in the stool ? Significant tenderness or worsening of abdominal pains ? Swelling of the abdomen that is new, acute ? Fever of 100?F or higher ? ?For urgent or emergent issues, a gastroenterologist can be reached at any hour by calling [phone]. ?Do not use MyChart messaging for urgent concerns.  ? ? ?DIET:  We do recommend a small meal at first, but then you may proceed to your regular diet.  Drink plenty of fluids but you should avoid alcoholic beverages for 24 hours. ? ?ACTIVITY:  You should plan to take it easy for the rest of today and you should NOT DRIVE or use heavy  machinery until tomorrow (because of the sedation medicines used during the test).   ? ?FOLLOW UP: ?Our staff will call the number listed on your records 48-72 hours following your procedure to check on you and address any questions or concerns that you may have regarding the information given to you following your procedure. If we do not reach you, we will leave a message.  We will attempt to reach you two times.  During this call, we will ask if you have developed any symptoms of COVID 19. If you develop any symptoms (ie: fever, flu-like symptoms, shortness of breath, cough etc.) before then, please call [phone].  If you test positive for Covid 19 in the 2 weeks post procedure, please call and report this information to us.   ? ?If any biopsies were taken you will be contacted by phone or by letter within the next 1-3 weeks.  Please call us at [phone] if you have not heard about the biopsies in 3 weeks.  ? ? ?SIGNATURES/CONFIDENTIALITY: ?You and/or your care partner have signed paperwork which will be entered into your electronic medical record.  These signatures attest to the fact that that the information above on your After Visit Summary has been reviewed and is understood.  Full responsibility of the confidentiality of this discharge information lies with you and/or your care-partner. ? ?

## 2020-06-24 NOTE — Progress Notes (Signed)
To PACU, VSS. Report to [email or web address] 

## 2020-06-24 NOTE — Progress Notes (Signed)
Pt's states no medical or surgical changes since previsit or office visit. 

## 2020-06-26 ENCOUNTER — Telehealth: Payer: Self-pay

## 2020-06-26 NOTE — Telephone Encounter (Signed)
Left message on answering machine. 

## 2020-06-26 NOTE — Telephone Encounter (Signed)
Called [phone] and left a message we tried to reach pt for a follow up call. maw

## 2020-09-01 ENCOUNTER — Encounter: Payer: Self-pay | Admitting: Family Medicine

## 2020-09-05 ENCOUNTER — Encounter: Payer: Self-pay | Admitting: Family Medicine

## 2021-07-01 ENCOUNTER — Encounter: Payer: Self-pay | Admitting: Family

## 2021-07-01 ENCOUNTER — Ambulatory Visit (INDEPENDENT_AMBULATORY_CARE_PROVIDER_SITE_OTHER): Payer: BC Managed Care – PPO | Admitting: Family

## 2021-07-01 VITALS — BP 118/78 | HR 58 | Temp 98.4°F | Ht 68.0 in | Wt 166.8 lb

## 2021-07-01 DIAGNOSIS — Z125 Encounter for screening for malignant neoplasm of prostate: Secondary | ICD-10-CM | POA: Diagnosis not present

## 2021-07-01 DIAGNOSIS — Z0000 Encounter for general adult medical examination without abnormal findings: Secondary | ICD-10-CM | POA: Diagnosis not present

## 2021-07-01 DIAGNOSIS — Z13220 Encounter for screening for lipoid disorders: Secondary | ICD-10-CM | POA: Diagnosis not present

## 2021-07-01 NOTE — Patient Instructions (Addendum)
Please return for fasting labs at your convenience; please fast for at least 8 hours;   Please ask your neurologist about the safety of you getting the Shingrix vaccine ;

## 2021-07-01 NOTE — Progress Notes (Signed)
Marc Jenkins is a 55 y.o. male with the following history as recorded in EpicCare:  Patient Active Problem List   Diagnosis Date Noted   Anemia 05/17/2019   Multiple food allergies 05/17/2019   Acute left-sided low back pain with sciatica 04/25/2016   Muscle cramp 11/12/2014   FH: colon cancer 05/04/2012   Hyperlipidemia 05/05/2011   Preventative health care 05/05/2011   Depression    Chicken pox    H/O measles    H/O mumps    Cluster headache syndrome 03/07/2010   Allergic rhinitis 03/07/2010    Current Outpatient Medications  Medication Sig Dispense Refill   Cholecalciferol (VITAMIN D3) 5000 UNITS TABS Take by mouth.     COD LIVER OIL PO Take by mouth.     Multiple Vitamin (MULTIVITAMIN) capsule Take 1 capsule by mouth daily.     NON FORMULARY MTHR=B 6- 2 daily     No current facility-administered medications for this visit.    Allergies: Corn-containing products, Gluten meal, and Versed [midazolam]  Past Medical History:  Diagnosis Date   Allergy    Celiac disease    Chicken pox as a child   Depression    FH: colon cancer 05/04/2012   patient denies this  dx - patient's father had adeno polyps  but not colon cancer   H/O measles    H/O mumps    Headache, cluster    Hyperlipidemia 05/05/2011   Left hand pain 05/04/2012   resolved per patient on 06/11/20   Measles as a child   Mumps as a child   Muscle cramp 11/12/2014   occasional    Preventative health care 05/05/2011    Past Surgical History:  Procedure Laterality Date   ACHILLES TENDON SURGERY  2-11   COLONOSCOPY  01/17/2015   Pyrtle - ssp polyp   TONSILLECTOMY AND ADENOIDECTOMY      Family History  Problem Relation Age of Onset   Ulcers Mother        in colon   Hyperlipidemia Mother    Colon polyps Father 55   Hyperlipidemia Sister    Diabetes Sister        type 2/type 2   Obesity Sister    Diabetes Sister    Hyperlipidemia Sister    Obesity Sister     Social History   Tobacco Use    Smoking status: Never   Smokeless tobacco: Never  Substance Use Topics   Alcohol use: Yes    Alcohol/week: 5.0 - 6.0 standard drinks    Types: 5 - 6 Glasses of wine per week    Subjective:  Presents for yearly CPE; not fasting today; no specific concerns;  History of celiac disease- managing well; Works with functional neurologist in [place]- would like to talk to this provider about Shingles vaccine; Scheduled for PT later this week- chronic shoulder/ knee issues;  Exercises 3-4 x / week;   Review of Systems  Constitutional: Negative.   HENT: Negative.    Eyes: Negative.   Respiratory: Negative.    Cardiovascular: Negative.   Gastrointestinal: Negative.   Genitourinary: Negative.   Skin: Negative.   Neurological: Negative.   Endo/Heme/Allergies: Negative.   Psychiatric/Behavioral: Negative.        Objective:  Vitals:   07/01/21 0835  BP: 118/78  Pulse: (!) 58  Temp: 98.4 F (36.9 C)  TempSrc: Oral  SpO2: 97%  Weight: 166 lb 12.8 oz (75.7 kg)  Height: _0  (1.727 m)  General: Well developed, well nourished, in no acute distress  Skin : Warm and dry.  Head: Normocephalic and atraumatic  Eyes: Sclera and conjunctiva clear; pupils round and reactive to light; extraocular movements intact  Ears: External normal; canals clear; tympanic membranes normal  Oropharynx: Pink, supple. No suspicious lesions  Neck: Supple without thyromegaly, adenopathy  Lungs: Respirations unlabored; clear to auscultation bilaterally without wheeze, rales, rhonchi  CVS exam: normal rate and regular rhythm.  Abdomen: Soft; nontender; nondistended; normoactive bowel sounds; no masses or hepatosplenomegaly  Musculoskeletal: No deformities; no active joint inflammation  Extremities: No edema, cyanosis, clubbing  Vessels: Symmetric bilaterally  Neurologic: Alert and oriented; speech intact; face symmetrical; moves all extremities well; CNII-XII intact without focal deficit   Assessment:   1. PE (physical exam), annual   2. Lipid screening   3. Prostate cancer screening     Plan:  Congratulated patient on commitment to his health; Age appropriate preventive healthcare needs addressed; encouraged regular eye doctor and dental exams; continue regular exercise; will update labs at later date; follow-up to be determined;  This visit occurred during the SARS-CoV-2 public health emergency.  Safety protocols were in place, including screening questions prior to the visit, additional usage of staff PPE, and extensive cleaning of exam room while observing appropriate contact time as indicated for disinfecting solutions.     Return for Fasting labs at patient's convenience.  Orders Placed This Encounter  Procedures   CBC with Differential/Platelet    Standing Status:   Future    Standing Expiration Date:   07/01/2022   Comp Met (CMET)    Standing Status:   Future    Standing Expiration Date:   07/01/2022   Lipid panel    Standing Status:   Future    Standing Expiration Date:   07/01/2022   TSH    Standing Status:   Future    Standing Expiration Date:   07/01/2022   PSA    Standing Status:   Future    Standing Expiration Date:   07/01/2022    Requested Prescriptions    No prescriptions requested or ordered in this encounter

## 2021-07-02 DIAGNOSIS — M79602 Pain in left arm: Secondary | ICD-10-CM | POA: Diagnosis not present

## 2021-07-02 DIAGNOSIS — M25561 Pain in right knee: Secondary | ICD-10-CM | POA: Diagnosis not present

## 2021-07-03 ENCOUNTER — Other Ambulatory Visit (INDEPENDENT_AMBULATORY_CARE_PROVIDER_SITE_OTHER): Payer: BC Managed Care – PPO

## 2021-07-03 DIAGNOSIS — Z125 Encounter for screening for malignant neoplasm of prostate: Secondary | ICD-10-CM

## 2021-07-03 DIAGNOSIS — Z13220 Encounter for screening for lipoid disorders: Secondary | ICD-10-CM

## 2021-07-03 DIAGNOSIS — Z0000 Encounter for general adult medical examination without abnormal findings: Secondary | ICD-10-CM

## 2021-07-03 LAB — LIPID PANEL
Cholesterol: 259 mg/dL — ABNORMAL HIGH (ref 0–200)
HDL: 60 mg/dL (ref >39.00)
LDL Cholesterol: 183 mg/dL — ABNORMAL HIGH (ref 0–99)
NonHDL: 198.98
Total CHOL/HDL Ratio: 4
Triglycerides: 81 mg/dL (ref 0.0–149.0)
VLDL: 16.2 mg/dL (ref 0.0–40.0)

## 2021-07-03 LAB — COMPREHENSIVE METABOLIC PANEL
ALT: 21 U/L (ref 0–53)
AST: 23 U/L (ref 0–37)
Albumin: 4.4 g/dL (ref 3.5–5.2)
Alkaline Phosphatase: 52 U/L (ref 39–117)
BUN: 13 mg/dL (ref 6–23)
CO2: 26 mEq/L (ref 19–32)
Calcium: 9.3 mg/dL (ref 8.4–10.5)
Chloride: 105 mEq/L (ref 96–112)
Creatinine, Ser: 1.01 mg/dL (ref 0.40–1.50)
GFR: 82.98 mL/min (ref >60.00)
Glucose, Bld: 85 mg/dL (ref 70–99)
Potassium: 4.4 mEq/L (ref 3.5–5.1)
Sodium: 140 mEq/L (ref 135–145)
Total Bilirubin: 0.5 mg/dL (ref 0.2–1.2)
Total Protein: 6.8 g/dL (ref 6.0–8.3)

## 2021-07-03 LAB — CBC WITH DIFFERENTIAL/PLATELET
Basophils Absolute: 0 10*3/uL (ref 0.0–0.1)
Basophils Relative: 0.6 % (ref 0.0–3.0)
Eosinophils Absolute: 0 10*3/uL (ref 0.0–0.7)
Eosinophils Relative: 1.2 % (ref 0.0–5.0)
HCT: 38.9 % — ABNORMAL LOW (ref 39.0–52.0)
Hemoglobin: 13 g/dL (ref 13.0–17.0)
Lymphocytes Relative: 41.6 % (ref 12.0–46.0)
Lymphs Abs: 1.5 10*3/uL (ref 0.7–4.0)
MCHC: 33.4 g/dL (ref 30.0–36.0)
MCV: 94 fl (ref 78.0–100.0)
Monocytes Absolute: 0.4 10*3/uL (ref 0.1–1.0)
Monocytes Relative: 12 % (ref 3.0–12.0)
Neutro Abs: 1.6 10*3/uL (ref 1.4–7.7)
Neutrophils Relative %: 44.6 % (ref 43.0–77.0)
Platelets: 250 10*3/uL (ref 150.0–400.0)
RBC: 4.14 Mil/uL — ABNORMAL LOW (ref 4.22–5.81)
RDW: 13.7 % (ref 11.5–15.5)
WBC: 3.5 10*3/uL — ABNORMAL LOW (ref 4.0–10.5)

## 2021-07-03 LAB — PSA: PSA: 1.36 ng/mL (ref 0.10–4.00)

## 2021-07-03 LAB — TSH: TSH: 1.85 u[IU]/mL (ref 0.35–5.50)

## 2021-09-04 ENCOUNTER — Encounter: Payer: Self-pay | Admitting: Family

## 2021-09-04 NOTE — Telephone Encounter (Signed)
I have called pt to offer virtual appointment on various slots for tomorrow. He stated that he is about to go to the [facility] and has meeting all morning and is not able to do a virtual. He declined E-visit and Urgent care since he is feeling a bit better today. He stated that if he feels bad after he comes back he will make a visit. I stated understanding.

## 2021-09-06 DIAGNOSIS — R0789 Other chest pain: Secondary | ICD-10-CM | POA: Diagnosis not present

## 2021-09-06 DIAGNOSIS — Z6823 Body mass index (BMI) 23.0-23.9, adult: Secondary | ICD-10-CM | POA: Diagnosis not present

## 2021-09-06 DIAGNOSIS — K900 Celiac disease: Secondary | ICD-10-CM | POA: Diagnosis not present

## 2021-09-06 DIAGNOSIS — R519 Headache, unspecified: Secondary | ICD-10-CM | POA: Diagnosis not present

## 2021-09-06 DIAGNOSIS — R059 Cough, unspecified: Secondary | ICD-10-CM | POA: Diagnosis not present

## 2021-12-09 DIAGNOSIS — L814 Other melanin hyperpigmentation: Secondary | ICD-10-CM | POA: Diagnosis not present

## 2021-12-09 DIAGNOSIS — D2271 Melanocytic nevi of right lower limb, including hip: Secondary | ICD-10-CM | POA: Diagnosis not present

## 2021-12-09 DIAGNOSIS — L821 Other seborrheic keratosis: Secondary | ICD-10-CM | POA: Diagnosis not present

## 2021-12-09 DIAGNOSIS — D225 Melanocytic nevi of trunk: Secondary | ICD-10-CM | POA: Diagnosis not present

## 2022-04-13 ENCOUNTER — Other Ambulatory Visit: Payer: Self-pay | Admitting: Family Medicine

## 2022-04-13 ENCOUNTER — Telehealth: Payer: Self-pay | Admitting: Family Medicine

## 2022-04-13 MED ORDER — MOLNUPIRAVIR EUA 200MG CAPSULE: 4.0000 | ORAL_CAPSULE | Freq: Two times a day (BID) | ORAL | 0 refills | Status: AC

## 2022-04-13 MED ORDER — NIRMATRELVIR/RITONAVIR (PAXLOVID)TABLET: 3.0000 | ORAL_TABLET | Freq: Two times a day (BID) | ORAL | 0 refills | Status: AC

## 2022-04-13 NOTE — Telephone Encounter (Signed)
Molnupiravir out of stock. Please advise.

## 2022-04-13 NOTE — Telephone Encounter (Signed)
[facility] [place] RECOR[facility]ccess[facility]urse Patient Name:Marc Jenkins  Caller states that he just tested positive for COVID and he would like to get a prescription for Paxlovid. He has a sore throat with a little bit of drainage. ---Caller states he tested positive for covid this morning. He has some sinus drainage and a sore throat Is this a behavioral health or substance abuse call? ---No Guidelines Guideline Title Affirmed Question Affirmed Notes Nurse Date/Time (Eastern Time) COVID-19 - Diagnosed or Suspected [1] COVID-19 diagnosed by positive lab test (e.g., PCR, rapid self-test kit) AND [2] mild symptoms (e.g., cough, fever, others) AND [2] no complications or SOB Ysidro Evert, RN, Levada Dy 04/13/2022 7:12:26 AM PLEASE NOTE: All timestamps contained within this report are represented as [place] Standard Time. CONFIDENTIALTY NOTICE: This fax transmission is intended only for the addressee. It contains information that is legally privileged, confidential or otherwise protected from use or disclosure. If you are not the intended recipient, you are strictly prohibited from reviewing, disclosing, copying using or disseminating any of this information or taking any action in reliance on or regarding this information. If you have received this fax in error, please notify us immediately by telephone so that we can arrange for its return to [place]. Phone: [phone], Toll-Free: [phone], Fax: [phone] Page: 2 of 2 Call Id: [id number] [place]. Time Eilene Ghazi Time) Disposition Final User 04/13/2022 7:21:14 AM Home Care Yes Ysidro Evert, RN, Levada Dy Final Disposition 04/13/2022 7:21:14 AM Home Care Yes Ysidro Evert, RN, Marin Shutter Disagree/Comply Comply Caller Understands Yes PreDisposition Did not know what to do Care Advice Given Per Guideline HOME CARE: * You should be able to treat this at home. REASSURANCE AND EDUCATION - POSITIVE COVID-19 LAB TEST AND MILD  SYMPTOMS: * You had a recent lab test for COVID-19 and it came back positive. CALL BACK IF: * Fever over 103 F (39.4 C) * Chest pain or difficulty breathing occurs * You become worse CARE ADVICE given per COVID-19 - DIAGNOSED OR SUSPECTED (Adult) guideline. After Care Instructions Given Call Event Type User Date / Time Description Education document email Earleen Reaper 04/13/2022 7:16:25 AM COVID-19 Diagnosed or Suspecte

## 2022-04-13 NOTE — Telephone Encounter (Signed)
Pt tested covid + today and is hoping to get paxlovid. He declined a vv.   [id number] [place], [place] - [address]  [address], [id number] Phone:  [phone] Fax:  [phone]

## 2022-04-13 NOTE — Telephone Encounter (Signed)
Called pt to determine date of onset 04/12/22 and tested positive  On today.

## 2022-04-13 NOTE — Telephone Encounter (Signed)
Called pt was advised  

## 2022-04-14 NOTE — Telephone Encounter (Signed)
Called pt and he got Rx

## 2022-06-29 ENCOUNTER — Encounter: Payer: Self-pay | Admitting: Family

## 2022-07-03 ENCOUNTER — Ambulatory Visit (INDEPENDENT_AMBULATORY_CARE_PROVIDER_SITE_OTHER): Payer: BC Managed Care – PPO | Admitting: Family

## 2022-07-03 ENCOUNTER — Telehealth: Payer: Self-pay | Admitting: Family

## 2022-07-03 ENCOUNTER — Encounter: Payer: BC Managed Care – PPO | Admitting: Family

## 2022-07-03 ENCOUNTER — Encounter: Payer: Self-pay | Admitting: Family

## 2022-07-03 VITALS — BP 124/80 | HR 58 | Temp 98.1°F | Resp 16 | Ht 68.0 in | Wt 165.4 lb

## 2022-07-03 DIAGNOSIS — Z1159 Encounter for screening for other viral diseases: Secondary | ICD-10-CM | POA: Diagnosis not present

## 2022-07-03 DIAGNOSIS — Z13220 Encounter for screening for lipoid disorders: Secondary | ICD-10-CM

## 2022-07-03 DIAGNOSIS — Z125 Encounter for screening for malignant neoplasm of prostate: Secondary | ICD-10-CM

## 2022-07-03 DIAGNOSIS — Z0000 Encounter for general adult medical examination without abnormal findings: Secondary | ICD-10-CM | POA: Diagnosis not present

## 2022-07-03 DIAGNOSIS — Z114 Encounter for screening for human immunodeficiency virus [HIV]: Secondary | ICD-10-CM | POA: Diagnosis not present

## 2022-07-03 DIAGNOSIS — E785 Hyperlipidemia, unspecified: Secondary | ICD-10-CM

## 2022-07-03 LAB — COMPREHENSIVE METABOLIC PANEL
ALT: 19 U/L (ref 0–53)
AST: 20 U/L (ref 0–37)
Albumin: 4.4 g/dL (ref 3.5–5.2)
Alkaline Phosphatase: 43 U/L (ref 39–117)
BUN: 16 mg/dL (ref 6–23)
CO2: 26 mEq/L (ref 19–32)
Calcium: 9.3 mg/dL (ref 8.4–10.5)
Chloride: 105 mEq/L (ref 96–112)
Creatinine, Ser: 0.96 mg/dL (ref 0.40–1.50)
GFR: 87.57 mL/min (ref >60.00)
Glucose, Bld: 86 mg/dL (ref 70–99)
Potassium: 4.1 mEq/L (ref 3.5–5.1)
Sodium: 139 mEq/L (ref 135–145)
Total Bilirubin: 0.5 mg/dL (ref 0.2–1.2)
Total Protein: 6.6 g/dL (ref 6.0–8.3)

## 2022-07-03 LAB — CBC WITH DIFFERENTIAL/PLATELET
Basophils Absolute: 0 10*3/uL (ref 0.0–0.1)
Basophils Relative: 0.4 % (ref 0.0–3.0)
Eosinophils Absolute: 0.1 10*3/uL (ref 0.0–0.7)
Eosinophils Relative: 1.9 % (ref 0.0–5.0)
HCT: 39.8 % (ref 39.0–52.0)
Hemoglobin: 13.4 g/dL (ref 13.0–17.0)
Lymphocytes Relative: 39.9 % (ref 12.0–46.0)
Lymphs Abs: 1.7 10*3/uL (ref 0.7–4.0)
MCHC: 33.7 g/dL (ref 30.0–36.0)
MCV: 92.4 fl (ref 78.0–100.0)
Monocytes Absolute: 0.5 10*3/uL (ref 0.1–1.0)
Monocytes Relative: 12.3 % — ABNORMAL HIGH (ref 3.0–12.0)
Neutro Abs: 1.9 10*3/uL (ref 1.4–7.7)
Neutrophils Relative %: 45.5 % (ref 43.0–77.0)
Platelets: 272 10*3/uL (ref 150.0–400.0)
RBC: 4.31 Mil/uL (ref 4.22–5.81)
RDW: 13.3 % (ref 11.5–15.5)
WBC: 4.2 10*3/uL (ref 4.0–10.5)

## 2022-07-03 LAB — PSA: PSA: 2.93 ng/mL (ref 0.10–4.00)

## 2022-07-03 NOTE — Telephone Encounter (Signed)
Pt sent via Mychart. I have sent them to you.

## 2022-07-03 NOTE — Progress Notes (Addendum)
Subjective:   By signing my name below, I, Marc Jenkins, attest that this documentation has been prepared under the direction and in the presence of Marc Chimera, NP 07/03/2022   Patient ID: Marc Jenkins, male    DOB: Feb 12, 1966, 56 y.o.   MRN: [id number]  Chief Complaint  Patient presents with   Annual Exam    Fasting    HPI Patient is in today for a comprehensive physical exam   Vitamin D: He has a history of low vitamin D levels and therefore takes 5000 units of Vitamin D3 supplements.   Blood Test/Functional Immunology: He regularly follows up with his functional immunologist, Dr.Yanuck. During the time of his visit, he received blood tests and states that his WBC was low but his A1C levels were elevated.   Cholesterol: His cholesterol levels are elevated but he states that it is of no significant concern.  Lab Results  Component Value Date   CHOL 259 (H) 07/03/2021   HDL 60.00 07/03/2021   LDLCALC 183 (H) 07/03/2021   LDLDIRECT 201.4 05/04/2012   TRIG 81.0 07/03/2021   CHOLHDL 4 07/03/2021    Cough: He reports that since coming back from a trip, he finds himself coughing. However, he notes that symptoms are improving. He denies of any fever or body aches.   Depression: He reports that depression is prevalent in his family history. Symptoms remain unchanged.   Moles: He reports that he had a mole removed by Dr.Haverstock on his left shin.   She denies having any fever, hearing or vision symptoms, new muscle pain, joint pain , new moles, rashes, congestion, sinus pain, sore throat, palpations, SOB ,wheezing,n/v/d constipation, blood in stool, dysuria, frequency, hematuria, depression, anxiety, headaches at this time  Social history: He reports that one of his sister tested out of diabetes and his daughter was diagnosed with auto dysnomia.  He reports no recent surgeries. He denies of any changes to his family medical history. He reports that he is no longer  drinking alcohol.  Colonoscopy: Last completed on 06/24/2020 PSA: Last completed on 07/03/2021 Immunizations: He is UTD on the tetanus vaccine. He has received the first three doses of the Covid vaccine and due to his Celiac is hesitant about receiving the updated booster. He has not received the Shingles vaccine but notes that he received the Shingles in his 73's. He is not interested in receiving the Shingles vaccine during today's visit. He is interested in a HIV/HepC screening.  Diet: He is maintaining a healthy diet. He states that he consumes low-sugar diets. He also does not consume glucose as he is intolerant to it.  Dental: He is UTD on dental exams.  Vision: He is UTD on vision exams.   Health Maintenance Due  Topic Date Due   Hepatitis C Screening  Never done   Zoster Vaccines- Shingrix (1 of 2) Never done   COVID-19 Vaccine (4 - 2023-24 season) 03/20/2022    Past Medical History:  Diagnosis Date   Allergy    Celiac disease    Chicken pox as a child   Depression    FH: colon cancer 05/04/2012   patient denies this  dx - patient's father had adeno polyps  but not colon cancer   H/O measles    H/O mumps    Headache, cluster    Hyperlipidemia 05/05/2011   Left hand pain 05/04/2012   resolved per patient on 06/11/20   Measles as a child   Mumps  as a child   Muscle cramp 11/12/2014   occasional    Preventative health care 05/05/2011    Past Surgical History:  Procedure Laterality Date   ACHILLES TENDON SURGERY  2-11   COLONOSCOPY  01/17/2015   Pyrtle - ssp polyp   TONSILLECTOMY AND ADENOIDECTOMY      Family History  Problem Relation Age of Onset   Ulcers Mother        in colon   Hyperlipidemia Mother    Colon polyps Father 31   Hyperlipidemia Sister    Diabetes Sister        type 2/type 2   Obesity Sister    Diabetes Sister    Hyperlipidemia Sister    Obesity Sister    Celiac disease Daughter        autodysnomia    Social History   Socioeconomic  History   Marital status: Married    Spouse name: Not on file   Number of children: Not on file   Years of education: Not on file   Highest education level: Not on file  Occupational History   Not on file  Tobacco Use   Smoking status: Never   Smokeless tobacco: Never  Vaping Use   Vaping Use: Never used  Substance and Sexual Activity   Alcohol use: Not Currently    Alcohol/week: 5.0 - 6.0 standard drinks of alcohol    Types: 5 - 6 Glasses of wine per week   Drug use: No   Sexual activity: Yes    Partners: Female  Other Topics Concern   Not on file  Social History Narrative   Works in Nurse, learning disability for Furniture conservator/restorer   Social Determinants of Radio broadcast assistant Strain: Not on file  Food Insecurity: Not on file  Transportation Needs: Not on file  Physical Activity: Not on file  Stress: Not on file  Social Connections: Not on file  Intimate Partner Violence: Not on file    Outpatient Medications Prior to Visit  Medication Sig Dispense Refill   Cholecalciferol (VITAMIN D3) 5000 UNITS TABS Take by mouth.     COD LIVER OIL PO Take by mouth.     Multiple Vitamin (MULTIVITAMIN) capsule Take 1 capsule by mouth daily.     NON FORMULARY MTHR=B 6- 2 daily     No facility-administered medications prior to visit.    Allergies  Allergen Reactions   Corn-Containing Products Diarrhea and Other (See Comments)   Gluten Meal Nausea And Vomiting and Other (See Comments)   Versed [Midazolam] Other (See Comments)    Combative, bad mood    Review of Systems  Constitutional:  Negative for fever.  HENT:  Negative for congestion, sinus pain and sore throat.   Respiratory:  Positive for cough. Negative for shortness of breath and wheezing.   Cardiovascular:  Negative for palpitations.  Gastrointestinal:  Negative for blood in stool, constipation, diarrhea, nausea and vomiting.  Genitourinary:  Negative for dysuria, frequency and hematuria.  Musculoskeletal:   Negative for joint pain and myalgias.  Skin:  Negative for rash.       (-) New Moles  Neurological:  Negative for headaches.  Psychiatric/Behavioral:  Negative for depression. The patient is not nervous/anxious.        Objective:    Physical Exam Constitutional:      General: He is not in acute distress.    Appearance: Normal appearance. He is not ill-appearing.  HENT:     Head: Normocephalic  and atraumatic.     Right Ear: Tympanic membrane, ear canal and external ear normal.     Left Ear: Tympanic membrane, ear canal and external ear normal.     Mouth/Throat:     Pharynx: No oropharyngeal exudate.  Eyes:     Extraocular Movements: Extraocular movements intact.     Pupils: Pupils are equal, round, and reactive to light.  Neck:     Thyroid: No thyromegaly.  Cardiovascular:     Rate and Rhythm: Normal rate and regular rhythm.     Heart sounds: Normal heart sounds. No murmur heard.    No gallop.  Pulmonary:     Effort: Pulmonary effort is normal. No respiratory distress.     Breath sounds: Normal breath sounds. No wheezing or rales.  Abdominal:     General: Bowel sounds are normal. There is no distension.     Palpations: Abdomen is soft.     Tenderness: There is no abdominal tenderness. There is no guarding.  Musculoskeletal:     Comments: 5/5 strength in both upper and lower extremities    Lymphadenopathy:     Cervical: No cervical adenopathy.  Skin:    General: Skin is warm and dry.  Neurological:     Mental Status: He is alert and oriented to person, place, and time.     Deep Tendon Reflexes:     Reflex Scores:      Patellar reflexes are 2+ on the right side and 2+ on the left side. Psychiatric:        Mood and Affect: Mood normal.        Behavior: Behavior normal.        Judgment: Judgment normal.     BP 124/80   Pulse (!) 58   Temp 98.1 F (36.7 C) (Oral)   Resp 16   Ht _0  (1.727 m)   Wt 165 lb 6 oz (75 kg)   SpO2 98%   BMI 25.15 kg/m  Wt Readings  from Last 3 Encounters:  07/03/22 165 lb 6 oz (75 kg)  07/01/21 166 lb 12.8 oz (75.7 kg)  06/24/20 170 lb (77.1 kg)       Assessment & Plan:   Problem List Items Addressed This Visit       Unprioritized   Preventative health care - Primary    Continue healthy diet, exercise.  Vision/dental  up to date. Colo up to date. Will request recent lab work from his Functional Specialist Dr. Carlisle Cater.  PSA today. Declines shingrix, flu shot and covid booster.       Relevant Orders   Comp Met (CMET)   CBC w/Diff   Other Visit Diagnoses     Screening for prostate cancer       Relevant Orders   PSA   Encounter for screening for HIV       Relevant Orders   HIV antibody (with reflex)   Encounter for hepatitis C screening test for low risk patient       Relevant Orders   Hepatitis C Antibody      No orders of the defined types were placed in this encounter.   I, Nance Pear, NP, personally preformed the services described in this documentation.  All medical record entries made by the scribe were at my direction and in my presence.  I have reviewed the chart and discharge instructions (if applicable) and agree that the record reflects my personal performance and is accurate and complete. 07/03/2022  I,Amber Collins,acting as a Education administrator for [employer], NP.,have documented all relevant documentation on the behalf of Nance Pear, NP,as directed by  Nance Pear, NP while in the presence of Nance Pear, NP.    Nance Pear, NP

## 2022-07-03 NOTE — Telephone Encounter (Signed)
Please call Dr Woodfin Ganja office to request copies of his most recent lab work:  [phone]

## 2022-07-03 NOTE — Assessment & Plan Note (Addendum)
Continue healthy diet, exercise.  Vision/dental  up to date. Colo up to date. Will request recent lab work from his Functional Specialist Dr. Carlisle Cater.  PSA today. Declines shingrix, flu shot and covid booster.

## 2022-07-06 LAB — HIV ANTIBODY (ROUTINE TESTING W REFLEX): HIV 1&2 Ab, 4th Generation: NONREACTIVE

## 2022-07-06 LAB — HEPATITIS C ANTIBODY: Hepatitis C Ab: NONREACTIVE

## 2022-07-08 ENCOUNTER — Telehealth: Payer: Self-pay | Admitting: Family

## 2022-07-08 DIAGNOSIS — E785 Hyperlipidemia, unspecified: Secondary | ICD-10-CM

## 2022-07-08 NOTE — Telephone Encounter (Signed)
Please advise pt that I noticed that his cholesterol did not get included in his lab draw- my apologies.  I would like him to return to the lab at his convenience for [place] please.

## 2022-07-09 NOTE — Telephone Encounter (Signed)
Patient advised of labs needed and scheduled to come in 07/21/22

## 2022-07-21 ENCOUNTER — Other Ambulatory Visit (INDEPENDENT_AMBULATORY_CARE_PROVIDER_SITE_OTHER): Payer: BC Managed Care – PPO

## 2022-07-21 DIAGNOSIS — E785 Hyperlipidemia, unspecified: Secondary | ICD-10-CM

## 2022-07-21 LAB — LIPID PANEL
Cholesterol: 232 mg/dL — ABNORMAL HIGH (ref 0–200)
HDL: 53.6 mg/dL (ref >39.00)
LDL Cholesterol: 152 mg/dL — ABNORMAL HIGH (ref 0–99)
NonHDL: 177.97
Total CHOL/HDL Ratio: 4
Triglycerides: 129 mg/dL (ref 0.0–149.0)
VLDL: 25.8 mg/dL (ref 0.0–40.0)

## 2023-02-09 DIAGNOSIS — M79601 Pain in right arm: Secondary | ICD-10-CM | POA: Diagnosis not present

## 2023-02-09 DIAGNOSIS — M542 Cervicalgia: Secondary | ICD-10-CM | POA: Diagnosis not present

## 2023-02-09 DIAGNOSIS — M79621 Pain in right upper arm: Secondary | ICD-10-CM | POA: Diagnosis not present

## 2023-02-25 DIAGNOSIS — M79601 Pain in right arm: Secondary | ICD-10-CM | POA: Diagnosis not present

## 2023-02-25 DIAGNOSIS — M542 Cervicalgia: Secondary | ICD-10-CM | POA: Diagnosis not present

## 2023-02-25 DIAGNOSIS — M79621 Pain in right upper arm: Secondary | ICD-10-CM | POA: Diagnosis not present

## 2023-05-21 ENCOUNTER — Encounter: Payer: Self-pay | Admitting: Family

## 2023-05-21 ENCOUNTER — Telehealth (INDEPENDENT_AMBULATORY_CARE_PROVIDER_SITE_OTHER): Payer: BC Managed Care – PPO | Admitting: Family

## 2023-05-21 ENCOUNTER — Encounter: Payer: Self-pay | Admitting: Family Medicine

## 2023-05-21 VITALS — Ht 68.0 in

## 2023-05-21 DIAGNOSIS — U071 COVID-19: Secondary | ICD-10-CM | POA: Diagnosis not present

## 2023-05-21 NOTE — Progress Notes (Signed)
Marc Jenkins is a 57 y.o. male with the following history as recorded in EpicCare:  Patient Active Problem List   Diagnosis Date Noted   Anemia 05/17/2019   Multiple food allergies 05/17/2019   Acute left-sided low back pain with sciatica 04/25/2016   Muscle cramp 11/12/2014   FH: colon cancer 05/04/2012   Hyperlipidemia 05/05/2011   Preventative health care 05/05/2011   Depression    Chicken pox    H/O measles    H/O mumps    Cluster headache syndrome 03/07/2010   Allergic rhinitis 03/07/2010    Current Outpatient Medications  Medication Sig Dispense Refill   Cholecalciferol (VITAMIN D3) 5000 UNITS TABS Take by mouth.     COD LIVER OIL PO Take by mouth.     Multiple Vitamin (MULTIVITAMIN) capsule Take 1 capsule by mouth daily.     NON FORMULARY MTHR=B 6- 2 daily     No current facility-administered medications for this visit.    Allergies: Corn-containing products, Gluten meal, and Versed [midazolam]  Past Medical History:  Diagnosis Date   Allergy    Celiac disease    Chicken pox as a child   Depression    FH: colon cancer 05/04/2012   patient denies this  dx - patient's father had adeno polyps  but not colon cancer   H/O measles    H/O mumps    Headache, cluster    Hyperlipidemia 05/05/2011   Left hand pain 05/04/2012   resolved per patient on 06/11/20   Measles as a child   Mumps as a child   Muscle cramp 11/12/2014   occasional    Preventative health care 05/05/2011    Past Surgical History:  Procedure Laterality Date   ACHILLES TENDON SURGERY  2-11   COLONOSCOPY  01/17/2015   Pyrtle - ssp polyp   TONSILLECTOMY AND ADENOIDECTOMY      Family History  Problem Relation Age of Onset   Ulcers Mother        in colon   Hyperlipidemia Mother    Colon polyps Father 41   Hyperlipidemia Sister    Diabetes Sister        type 2/type 2   Obesity Sister    Diabetes Sister    Hyperlipidemia Sister    Obesity Sister    Celiac disease Daughter         autodysnomia    Social History   Tobacco Use   Smoking status: Never   Smokeless tobacco: Never  Substance Use Topics   Alcohol use: Not Currently    Alcohol/week: 5.0 - 6.0 standard drinks of alcohol    Types: 5 - 6 Glasses of wine per week    Subjective:    I connected with Marc Jenkins on 05/21/23 at  1:40 PM EDT by a video enabled telemedicine application and verified that I am speaking with the correct person using two identifiers.   I discussed the limitations of evaluation and management by telemedicine and the availability of in person appointments. The patient expressed understanding and agreed to proceed. Provider in office/ patient is at home; provider and patient are only 2 people on video call.   Patient notes that he tested positive for COVID earlier today; had returned from recent travel and is experiencing sore thorat, fatigue; denies any fever; he would like to get Paxlovid prescribed; no current up to date GFR available;   Objective:  Vitals:   05/21/23 1349  Height: 5\' 8"  (1.727 m)  General: Well developed, well nourished, in no acute distress  Skin : Warm and dry.  Head: Normocephalic and atraumatic  Lungs: Respirations unlabored;  Neurologic: Alert and oriented; speech intact; face symmetrical;   Assessment:  1. COVID-19     Plan:  Discussed need to get updated renal functions for Paxlovid and patient unable to do this today; he will try symptomatic treatment including Advil or Tylenol/ making sure to stay hydrated and rest; did discuss that he could still start the Paxlovid on Monday if his symptoms do not resolve quickly as would hope they would; he will contact our office if symptoms persist.   No follow-ups on file.  No orders of the defined types were placed in this encounter.   Requested Prescriptions    No prescriptions requested or ordered in this encounter

## 2023-07-05 ENCOUNTER — Encounter: Payer: Self-pay | Admitting: Family Medicine

## 2023-07-05 DIAGNOSIS — Z125 Encounter for screening for malignant neoplasm of prostate: Secondary | ICD-10-CM

## 2023-07-05 DIAGNOSIS — E785 Hyperlipidemia, unspecified: Secondary | ICD-10-CM

## 2023-07-05 DIAGNOSIS — Z0000 Encounter for general adult medical examination without abnormal findings: Secondary | ICD-10-CM

## 2023-07-08 ENCOUNTER — Encounter: Payer: Self-pay | Admitting: Family Medicine

## 2023-07-08 ENCOUNTER — Ambulatory Visit (INDEPENDENT_AMBULATORY_CARE_PROVIDER_SITE_OTHER): Payer: BC Managed Care – PPO | Admitting: Family Medicine

## 2023-07-08 ENCOUNTER — Encounter: Payer: BC Managed Care – PPO | Admitting: Family Medicine

## 2023-07-08 VITALS — BP 131/74 | HR 74 | Ht 68.0 in | Wt 168.0 lb

## 2023-07-08 DIAGNOSIS — Z0000 Encounter for general adult medical examination without abnormal findings: Secondary | ICD-10-CM

## 2023-07-08 NOTE — Progress Notes (Signed)
Complete physical exam  Patient: Marc Jenkins   DOB: 1965/08/02   57 y.o. Male  MRN: [id number]  Subjective:    Chief Complaint  Patient presents with   Annual Exam    Marc Jenkins is a 57 y.o. male who presents today for a complete physical exam. He reports consuming a general diet. Gym/ health club routine includes weights and cardia. He generally feels well. He reports sleeping well. He does not have additional problems to discuss today.   Currently lives with: wife, daughter Acute concerns or interim problems since last visit: no  Vision concerns: no Dental concerns: no   ETOH use: 6 servings per week, wine/cider Nicotine use: no Recreational drugs/illegal substances: no       Most recent fall risk assessment:    07/08/2023    1:13 PM  Fall Risk   Falls in the past year? 0  Number falls in past yr: 0  Injury with Fall? 0  Risk for fall due to : No Fall Risks  Follow up Falls evaluation completed     Most recent depression screenings:    07/08/2023    1:13 PM 07/03/2022    7:44 AM  PHQ 2/9 Scores  PHQ - 2 Score 1 2  PHQ- 9 Score  5            Patient Care Team: Bradd Canary, MD as PCP - General   Outpatient Medications Prior to Visit  Medication Sig   Cholecalciferol (VITAMIN D3) 5000 UNITS TABS Take by mouth.   COD LIVER OIL PO Take by mouth.   Fish Oil-Cholecalciferol (FISH OIL + D3 PO) Take by mouth.   Multiple Vitamin (MULTIVITAMIN) capsule Take 1 capsule by mouth daily.   NON FORMULARY MTHR=B 6- 2 daily   No facility-administered medications prior to visit.    ROS All review of systems negative except what is listed in the HPI        Objective:     BP 131/74   Pulse 74   Ht 5\' 8"  (1.727 m)   Wt 168 lb (76.2 kg)   SpO2 99%   BMI 25.54 kg/m    Physical Exam Vitals reviewed.  Constitutional:      General: He is not in acute distress.    Appearance: Normal appearance. He is not ill-appearing.  HENT:      Head: Normocephalic and atraumatic.     Right Ear: Tympanic membrane normal.     Left Ear: Tympanic membrane normal.     Nose: Nose normal.     Mouth/Throat:     Mouth: Mucous membranes are moist.     Pharynx: Oropharynx is clear.  Eyes:     Extraocular Movements: Extraocular movements intact.     Conjunctiva/sclera: Conjunctivae normal.     Pupils: Pupils are equal, round, and reactive to light.  Neck:     Vascular: No carotid bruit.  Cardiovascular:     Rate and Rhythm: Normal rate and regular rhythm.     Pulses: Normal pulses.     Heart sounds: Normal heart sounds.  Pulmonary:     Effort: Pulmonary effort is normal.     Breath sounds: Normal breath sounds.  Abdominal:     General: Abdomen is flat. Bowel sounds are normal. There is no distension.     Palpations: Abdomen is soft. There is no mass.     Tenderness: There is no abdominal tenderness. There is no right CVA tenderness, left  CVA tenderness, guarding or rebound.  Genitourinary:    Comments: Deferred exam Musculoskeletal:        General: Normal range of motion.     Cervical back: Normal range of motion and neck supple. No tenderness.     Right lower leg: No edema.     Left lower leg: No edema.  Lymphadenopathy:     Cervical: No cervical adenopathy.  Skin:    General: Skin is warm and dry.     Capillary Refill: Capillary refill takes less than 2 seconds.  Neurological:     General: No focal deficit present.     Mental Status: He is alert and oriented to person, place, and time. Mental status is at baseline.  Psychiatric:        Mood and Affect: Mood normal.        Behavior: Behavior normal.        Thought Content: Thought content normal.        Judgment: Judgment normal.         No results found for any visits on 07/08/23.     Assessment & Plan:    Routine Health Maintenance and Physical Exam Discussed health promotion and safety including diet and exercise recommendations, dental health, and  injury prevention. Tobacco cessation if applicable. Seat belts, sunscreen, smoke detectors, etc.    Immunization History  Administered Date(s) Administered   PFIZER(Purple Top)SARS-COV-2 Vaccination 09/29/2019, 10/23/2019, 08/30/2020   PPD Test 11/10/2011, 12/13/2013   Tdap 07/20/2004, 05/07/2014    Health Maintenance  Topic Date Due   Zoster Vaccines- Shingrix (1 of 2) Never done   INFLUENZA VACCINE  Never done   COVID-19 Vaccine (4 - 2024-25 season) 07/07/2024 (Originally 03/21/2023)   DTaP/Tdap/Td (3 - Td or Tdap) 05/07/2024   Colonoscopy  06/24/2025   Hepatitis C Screening  Completed   HIV Screening  Completed   HPV VACCINES  Aged Out        Problem List Items Addressed This Visit   None Visit Diagnoses       Annual physical exam    -  Primary Lab orders previously placed. He is having them drawn tomorrow, fasting.  Health promotion and safety discussed.       Return in about 1 year (around 07/07/2024) for physical.     Clayborne Dana, NP

## 2023-07-09 ENCOUNTER — Other Ambulatory Visit: Payer: BC Managed Care – PPO

## 2023-07-09 ENCOUNTER — Other Ambulatory Visit (INDEPENDENT_AMBULATORY_CARE_PROVIDER_SITE_OTHER): Payer: BC Managed Care – PPO

## 2023-07-09 DIAGNOSIS — Z0000 Encounter for general adult medical examination without abnormal findings: Secondary | ICD-10-CM | POA: Diagnosis not present

## 2023-07-09 DIAGNOSIS — Z125 Encounter for screening for malignant neoplasm of prostate: Secondary | ICD-10-CM

## 2023-07-09 DIAGNOSIS — E785 Hyperlipidemia, unspecified: Secondary | ICD-10-CM

## 2023-07-09 LAB — CBC WITH DIFFERENTIAL/PLATELET
Basophils Absolute: 0 10*3/uL (ref 0.0–0.1)
Basophils Relative: 0.4 % (ref 0.0–3.0)
Eosinophils Absolute: 0.1 10*3/uL (ref 0.0–0.7)
Eosinophils Relative: 1.1 % (ref 0.0–5.0)
HCT: 40.4 % (ref 39.0–52.0)
Hemoglobin: 13.3 g/dL (ref 13.0–17.0)
Lymphocytes Relative: 42.3 % (ref 12.0–46.0)
Lymphs Abs: 2.1 10*3/uL (ref 0.7–4.0)
MCHC: 32.9 g/dL (ref 30.0–36.0)
MCV: 94.7 fL (ref 78.0–100.0)
Monocytes Absolute: 0.6 10*3/uL (ref 0.1–1.0)
Monocytes Relative: 12.2 % — ABNORMAL HIGH (ref 3.0–12.0)
Neutro Abs: 2.2 10*3/uL (ref 1.4–7.7)
Neutrophils Relative %: 44 % (ref 43.0–77.0)
Platelets: 260 10*3/uL (ref 150.0–400.0)
RBC: 4.27 Mil/uL (ref 4.22–5.81)
RDW: 13.1 % (ref 11.5–15.5)
WBC: 5.1 10*3/uL (ref 4.0–10.5)

## 2023-07-09 LAB — COMPREHENSIVE METABOLIC PANEL
ALT: 19 U/L (ref 0–53)
AST: 20 U/L (ref 0–37)
Albumin: 4.5 g/dL (ref 3.5–5.2)
Alkaline Phosphatase: 47 U/L (ref 39–117)
BUN: 22 mg/dL (ref 6–23)
CO2: 24 meq/L (ref 19–32)
Calcium: 9.1 mg/dL (ref 8.4–10.5)
Chloride: 105 meq/L (ref 96–112)
Creatinine, Ser: 1.06 mg/dL (ref 0.40–1.50)
GFR: 77.2 mL/min (ref >60.00)
Glucose, Bld: 88 mg/dL (ref 70–99)
Potassium: 4.3 meq/L (ref 3.5–5.1)
Sodium: 140 meq/L (ref 135–145)
Total Bilirubin: 0.5 mg/dL (ref 0.2–1.2)
Total Protein: 6.6 g/dL (ref 6.0–8.3)

## 2023-07-09 LAB — LIPID PANEL
Cholesterol: 243 mg/dL — ABNORMAL HIGH (ref 0–200)
HDL: 56.6 mg/dL (ref >39.00)
LDL Cholesterol: 161 mg/dL — ABNORMAL HIGH (ref 0–99)
NonHDL: 186.01
Total CHOL/HDL Ratio: 4
Triglycerides: 127 mg/dL (ref 0.0–149.0)
VLDL: 25.4 mg/dL (ref 0.0–40.0)

## 2023-07-09 LAB — PSA: PSA: 2.19 ng/mL (ref 0.10–4.00)

## 2023-12-14 DIAGNOSIS — D2271 Melanocytic nevi of right lower limb, including hip: Secondary | ICD-10-CM | POA: Diagnosis not present

## 2023-12-14 DIAGNOSIS — L814 Other melanin hyperpigmentation: Secondary | ICD-10-CM | POA: Diagnosis not present

## 2023-12-14 DIAGNOSIS — D225 Melanocytic nevi of trunk: Secondary | ICD-10-CM | POA: Diagnosis not present

## 2023-12-14 DIAGNOSIS — L821 Other seborrheic keratosis: Secondary | ICD-10-CM | POA: Diagnosis not present

## 2023-12-23 ENCOUNTER — Ambulatory Visit: Admitting: Family Medicine

## 2023-12-23 ENCOUNTER — Encounter: Payer: Self-pay | Admitting: Family Medicine

## 2023-12-23 ENCOUNTER — Ambulatory Visit: Payer: Self-pay

## 2023-12-23 VITALS — BP 147/89 | HR 55 | Temp 97.9°F | Ht 68.0 in | Wt 169.0 lb

## 2023-12-23 DIAGNOSIS — W57XXXA Bitten or stung by nonvenomous insect and other nonvenomous arthropods, initial encounter: Secondary | ICD-10-CM | POA: Diagnosis not present

## 2023-12-23 DIAGNOSIS — S40862A Insect bite (nonvenomous) of left upper arm, initial encounter: Secondary | ICD-10-CM

## 2023-12-23 MED ORDER — DOXYCYCLINE HYCLATE 100 MG PO TABS
200.0000 mg | ORAL_TABLET | Freq: Once | ORAL | 0 refills | Status: AC
Start: 2023-12-23 — End: 2023-12-23

## 2023-12-23 NOTE — Progress Notes (Signed)
 Acute Office Visit  Subjective:     Patient ID: Marc Jenkins, male    DOB: 1965-09-12, 58 y.o.   MRN: [id number]  Chief Complaint  Patient presents with   Insect Bite    HPI Patient is in today for bug bite.   Discussed the use of AI scribe software for clinical note transcription with the patient, who gave verbal consent to proceed.  History of Present Illness Marc Jenkins "[name]" is a 58 year old male who presents with a bite on his arm and subsequent swelling and discoloration.  He was bitten by an unknown insect or arachnid on the back of his arm, just above the elbow, approximately 3 days ago. This resulted in significant swelling, described as a 'big golf ball' of fluid, which settled into his elbow and down his arm.  He treated the swelling with castor oil, light massage, and Benadryl, which led to a reduction in swelling. As of yesterday, the swelling has mostly resolved, leaving behind some discoloration resembling a bruise. No pain or significant tenderness is present, but there is new tenderness where the swelling had settled.  He has a history of tendonitis in the forearm related to weight training, but he does not believe this is related to the current issue. No recent trauma to the area.  His wife was concerned about the possibility of infection due to the bite, prompting the visit. He did not see what bit him. The initial sensation was a 'little sting' but not itchy or painful.         ROS All review of systems negative except what is listed in the HPI      Objective:    BP (!) 147/89   Pulse (!) 55   Temp 97.9 F (36.6 C) (Oral)   Ht 5\' 8"  (1.727 m)   Wt 169 lb (76.7 kg)   SpO2 100%   BMI 25.70 kg/m    Physical Exam Vitals reviewed.  Constitutional:      Appearance: Normal appearance.  Musculoskeletal:        General: No swelling. Normal range of motion.  Skin:    Comments: Slight discoloration and bruising around left elbow.  See picture. No warmth, erythema, abscess, streaking, or edema.  Neurological:     Mental Status: He is alert and oriented to person, place, and time.  Psychiatric:        Mood and Affect: Mood normal.        Behavior: Behavior normal.        Thought Content: Thought content normal.        Judgment: Judgment normal.       No results found for any visits on 12/23/23.      Assessment & Plan:   Problem List Items Addressed This Visit   None Visit Diagnoses       Insect bite of left upper arm, initial encounter    -  Primary   Relevant Medications   doxycycline (VIBRA-TABS) 100 MG tablet      Assessment & Plan Insect bite with localized reaction Swelling reduced, discoloration present. No significant pain or itching. No current signs of infection but prophylactic doxycycline considered due to potential spider or tick bite. - Prescribe single dose doxycycline for prophylaxis. - Advise warm compresses several times daily. - Instruct to monitor for changes and report. - Advise taking pictures for comparison and updates.      Meds ordered this encounter  Medications  doxycycline (VIBRA-TABS) 100 MG tablet    Sig: Take 2 tablets (200 mg total) by mouth once for 1 dose.    Dispense:  2 tablet    Refill:  0    Supervising Provider:   Randie Bustle A [[id number]]    Return if symptoms worsen or fail to improve.  Everlina Hock, NP

## 2023-12-23 NOTE — Telephone Encounter (Signed)
 Copied from CRM [id number]. Topic: Clinical - Red Word Triage >> Dec 23, 2023 10:40 AM Freya Jesus wrote: Red Word that prompted transfer to Nurse Triage: Patient stated he has a bug bite on his elbow and it changed color to a dark red patch.   FYI Only or Action Required?: FYI only for provider  Patient was last seen in primary care on 07/08/2023 by Everlina Hock, NP. Called Nurse Triage reporting Insect Bite. Symptoms began 2 days ago. Interventions attempted: OTC medications: Antihistamines, Castor oil. Symptoms are: gradually worsening.  Triage Disposition: See Physician Within 24 Hours  Patient/caregiver understands and will follow disposition?:    Reason for Disposition  [1] Red or very tender (to touch) area AND [2] getting larger over 48 hours after the bite  Answer Assessment - Initial Assessment Questions 1. TYPE of INSECT: "What type of insect was it?"      Unsure  2. ONSET: "When did you get bitten?"      2 days ago  3. LOCATION: "Where is the insect bite located?"      Left elbow 4. REDNESS: "Is the area red or pink?" If Yes, ask: "What size is area of redness?" (inches or cm). "When did the redness start?"     Yes, large dark red patch around elbow extending up and down arm  5. PAIN: "Is there any pain?" If Yes, ask: "How bad is it?"  (Scale 1-10; or mild, moderate, severe)     No 6. ITCHING: "Does it itch?" If Yes, ask: "How bad is the itch?"    - MILD: doesn't interfere with normal activities   - MODERATE-SEVERE: interferes with work, school, sleep, or other activities      No 7. SWELLING: "How big is the swelling?" (inches, cm, or compare to coins)     Swelling now resolved  8. OTHER SYMPTOMS: "Do you have any other symptoms?"  (e.g., difficulty breathing, hives)     No  Protocols used: Insect Bite-A-AH

## 2024-01-04 DIAGNOSIS — M25522 Pain in left elbow: Secondary | ICD-10-CM | POA: Diagnosis not present

## 2024-07-07 ENCOUNTER — Encounter: Payer: Self-pay | Admitting: Family Medicine

## 2024-07-07 DIAGNOSIS — E785 Hyperlipidemia, unspecified: Secondary | ICD-10-CM

## 2024-07-07 DIAGNOSIS — Z0000 Encounter for general adult medical examination without abnormal findings: Secondary | ICD-10-CM

## 2024-07-07 DIAGNOSIS — Z125 Encounter for screening for malignant neoplasm of prostate: Secondary | ICD-10-CM

## 2024-07-07 NOTE — Telephone Encounter (Signed)
 Returned pt's call and he wanted to  have labs done the morning before his appointment on 07/10/24 @ 7:45 AM. Appt was scheduled and lab order was placed for pt.

## 2024-07-09 NOTE — Assessment & Plan Note (Signed)
 Encouraged heart healthy diet, increase exercise, avoid trans fats, consider a krill oil cap daily

## 2024-07-09 NOTE — Progress Notes (Unsigned)
 "  Subjective:    Patient ID: Marc Jenkins, male    DOB: 1965/12/16, 58 y.o.   MRN: [id number]  No chief complaint on file.   HPI Discussed the use of AI scribe software for clinical note transcription with the patient, who gave verbal consent to proceed.  History of Present Illness Marc Jenkins is a 58 year old male who presents for a routine follow-up and management of insomnia and stress.  He experiences ongoing insomnia, characterized by waking up at 2:00 to 2:30 AM. He falls asleep easily but struggles to maintain sleep. His wife has suggested this may be due to stress and cortisol levels. He uses magnesium glycinate, taking three tablets before bed, and melatonin with varying effectiveness.  He manages stress by reducing alcohol and coffee intake and maintaining regular physical activity, going to the gym three to four times a week. He feels 'crankier than normal' due to stress and is considering therapy, preferring in-person sessions over online therapy.  He follows a gluten and corn-free diet, which he finds challenging while traveling, especially within the [place] . He uses technology to find suitable dining options and mentions that his daughter, Laneta, is adept at managing dietary restrictions.  He is due for a colonoscopy in 2026, having had his last one in 2021, with no current bowel issues.  He is updating his advanced directives and healthcare power of attorney due to changes in family circumstances, emphasizing the importance of having these documents accessible in case of emergency.  His daughter, Natalie, is preparing to travel to the [place] for six months. No complaints of CP/palp/SOB/HA/congestion/fevers/GI or GU c/o. Taking meds as prescribed    Past Medical History:  Diagnosis Date   Allergy    Celiac disease    Chicken pox as a child   Depression    FH: colon cancer 05/04/2012   patient denies this  dx - patient's father had adeno polyps   but not colon cancer   H/O measles    H/O mumps    Headache, cluster    Hyperlipidemia 05/05/2011   Left hand pain 05/04/2012   resolved per patient on 06/11/20   Measles as a child   Mumps as a child   Muscle cramp 11/12/2014   occasional    Preventative health care 05/05/2011    Past Surgical History:  Procedure Laterality Date   ACHILLES TENDON SURGERY  2-11   COLONOSCOPY  01/17/2015   Pyrtle - ssp polyp   TONSILLECTOMY AND ADENOIDECTOMY      Family History  Problem Relation Age of Onset   Ulcers Mother        in colon   Hyperlipidemia Mother    Colon polyps Father 32   Hyperlipidemia Sister    Diabetes Sister        type 2/type 2   Obesity Sister    Diabetes Sister    Hyperlipidemia Sister    Obesity Sister    Celiac disease Daughter        autodysnomia    Social History   Socioeconomic History   Marital status: Married    Spouse name: Not on file   Number of children: Not on file   Years of education: Not on file   Highest education level: Not on file  Occupational History   Not on file  Tobacco Use   Smoking status: Never   Smokeless tobacco: Never  Vaping Use   Vaping status: Never Used  Substance and  Sexual Activity   Alcohol use: Not Currently    Alcohol/week: 5.0 - 6.0 standard drinks of alcohol    Types: 5 - 6 Glasses of wine per week   Drug use: No   Sexual activity: Yes    Partners: Female  Other Topics Concern   Not on file  Social History Narrative   Works in corporate treasurer for [employer]   Social Drivers of Health   Tobacco Use: Low Risk (12/23/2023)   Patient History    Smoking Tobacco Use: Never    Smokeless Tobacco Use: Never    Passive Exposure: Not on file  Financial Resource Strain: Not on file  Food Insecurity: Not on file  Transportation Needs: Not on file  Physical Activity: Not on file  Stress: Not on file  Social Connections: Not on file  Intimate Partner Violence: Not on file  Depression (PHQ2-9):  Low Risk (07/08/2023)   Depression (PHQ2-9)    PHQ-2 Score: 1  Alcohol Screen: Not on file  Housing: Not on file  Utilities: Not on file  Health Literacy: Not on file    Outpatient Medications Prior to Visit  Medication Sig Dispense Refill   Cholecalciferol (VITAMIN D3) 5000 UNITS TABS Take by mouth.     COD LIVER OIL PO Take by mouth.     Fish Oil-Cholecalciferol (FISH OIL + D3 PO) Take by mouth.     Multiple Vitamin (MULTIVITAMIN) capsule Take 1 capsule by mouth daily.     NON FORMULARY MTHR=B 6- 2 daily     No facility-administered medications prior to visit.    Allergies[1]  Review of Systems  Constitutional:  Negative for chills, fever and malaise/fatigue.  HENT:  Negative for congestion and hearing loss.   Eyes:  Negative for blurred vision and discharge.  Respiratory:  Negative for cough, sputum production and shortness of breath.   Cardiovascular:  Negative for chest pain, palpitations and leg swelling.  Gastrointestinal:  Negative for abdominal pain, blood in stool, constipation, diarrhea, heartburn, nausea and vomiting.  Genitourinary:  Negative for dysuria, frequency, hematuria and urgency.  Musculoskeletal:  Negative for back pain, falls and myalgias.  Skin:  Negative for rash.  Neurological:  Negative for dizziness, sensory change, loss of consciousness, weakness and headaches.  Endo/Heme/Allergies:  Negative for environmental allergies. Does not bruise/bleed easily.  Psychiatric/Behavioral:  Negative for depression and suicidal ideas. The patient is nervous/anxious and has insomnia.        Objective:    Physical Exam Vitals reviewed.  Constitutional:      General: He is not in acute distress.    Appearance: Normal appearance. He is not ill-appearing or diaphoretic.  HENT:     Head: Normocephalic and atraumatic.     Right Ear: Tympanic membrane, ear canal and external ear normal. There is no impacted cerumen.     Left Ear: Tympanic membrane, ear canal and  external ear normal. There is no impacted cerumen.     Nose: Nose normal. No rhinorrhea.     Mouth/Throat:     Pharynx: Oropharynx is clear.  Eyes:     General: No scleral icterus.    Extraocular Movements: Extraocular movements intact.     Conjunctiva/sclera: Conjunctivae normal.     Pupils: Pupils are equal, round, and reactive to light.  Neck:     Thyroid : No thyroid  mass or thyroid  tenderness.  Cardiovascular:     Rate and Rhythm: Normal rate and regular rhythm.     Pulses: Normal pulses.  Heart sounds: Normal heart sounds. No murmur heard. Pulmonary:     Effort: Pulmonary effort is normal.     Breath sounds: Normal breath sounds. No wheezing.  Abdominal:     General: Bowel sounds are normal.     Palpations: Abdomen is soft. There is no mass.     Tenderness: There is no abdominal tenderness. There is no guarding.  Musculoskeletal:        General: No swelling. Normal range of motion.     Cervical back: Normal range of motion and neck supple. No rigidity.     Right lower leg: No edema.     Left lower leg: No edema.  Lymphadenopathy:     Cervical: No cervical adenopathy.  Skin:    General: Skin is warm and dry.     Findings: No rash.  Neurological:     General: No focal deficit present.     Mental Status: He is alert and oriented to person, place, and time.     Cranial Nerves: No cranial nerve deficit.     Deep Tendon Reflexes: Reflexes normal.  Psychiatric:        Mood and Affect: Mood normal.        Behavior: Behavior normal.    There were no vitals taken for this visit. Wt Readings from Last 3 Encounters:  12/23/23 169 lb (76.7 kg)  07/08/23 168 lb (76.2 kg)  07/03/22 165 lb 6 oz (75 kg)    Diabetic Foot Exam - Simple   No data filed    Lab Results  Component Value Date   WBC 5.1 07/09/2023   HGB 13.3 07/09/2023   HCT 40.4 07/09/2023   PLT 260.0 07/09/2023   GLUCOSE 88 07/09/2023   CHOL 243 (H) 07/09/2023   TRIG 127.0 07/09/2023   HDL 56.60  07/09/2023   LDLDIRECT 201.4 05/04/2012   LDLCALC 161 (H) 07/09/2023   ALT 19 07/09/2023   AST 20 07/09/2023   NA 140 07/09/2023   K 4.3 07/09/2023   CL 105 07/09/2023   CREATININE 1.06 07/09/2023   BUN 22 07/09/2023   CO2 24 07/09/2023   TSH 1.85 07/03/2021   PSA 2.19 07/09/2023    Lab Results  Component Value Date   TSH 1.85 07/03/2021   Lab Results  Component Value Date   WBC 5.1 07/09/2023   HGB 13.3 07/09/2023   HCT 40.4 07/09/2023   MCV 94.7 07/09/2023   PLT 260.0 07/09/2023   Lab Results  Component Value Date   NA 140 07/09/2023   K 4.3 07/09/2023   CO2 24 07/09/2023   GLUCOSE 88 07/09/2023   BUN 22 07/09/2023   CREATININE 1.06 07/09/2023   BILITOT 0.5 07/09/2023   ALKPHOS 47 07/09/2023   AST 20 07/09/2023   ALT 19 07/09/2023   PROT 6.6 07/09/2023   ALBUMIN 4.5 07/09/2023   CALCIUM 9.1 07/09/2023   GFR 77.20 07/09/2023   Lab Results  Component Value Date   CHOL 243 (H) 07/09/2023   Lab Results  Component Value Date   HDL 56.60 07/09/2023   Lab Results  Component Value Date   LDLCALC 161 (H) 07/09/2023   Lab Results  Component Value Date   TRIG 127.0 07/09/2023   Lab Results  Component Value Date   CHOLHDL 4 07/09/2023   No results found for: HGBA1C     Assessment & Plan:  Preventative health care Assessment & Plan: Patient encouraged to maintain heart healthy diet, regular exercise, adequate sleep. Consider  daily probiotics. Take medications as prescribed. Decliens flu shot. Colonoscopy due in 2021. Labs ordered and reviewed.    Hyperlipidemia, unspecified hyperlipidemia type Assessment & Plan: Encouraged heart healthy diet, increase exercise, avoid trans fats, consider a krill oil cap daily   Muscle cramp Assessment & Plan: increase hydration and check labs     Assessment and Plan Assessment & Plan Insomnia Chronic insomnia with difficulty maintaining sleep, particularly waking at 2 AM. Current management includes  magnesium glycinate and melatonin. Discussed non-pharmacological interventions such as CBTI Coach app and L-tryptophan supplementation. Emphasized the importance of sleep hygiene and stress reduction techniques. - Recommended CBTI Coach app for sleep improvement - Continue magnesium glycinate and melatonin - Consider L-tryptophan supplementation or warm milk with carbohydrates for sleep  Stress management Chronic stress contributing to insomnia. Discussed lifestyle modifications including regular exercise, reduced alcohol and coffee intake, and hydration. Consideration of therapy for stress management, with preference for in-person sessions but open to hybrid options due to travel. - Continue regular exercise and stress-reducing lifestyle modifications - Consider therapy referral through [facility] if needed  General Health Maintenance Due for colonoscopy in 2026. Discussed importance of hydration, especially as lean muscle mass decreases with age. Reviewed advanced directives and healthcare power of attorney, with plans to update them. - Will schedule colonoscopy in 2026 - Ensure adequate hydration, aiming for 5-10 ounces every 1-2 hours - Update advanced directives and healthcare power of attorney  Recording duration: 19 minutes     Harlene Horton, MD    [1]  Allergies Allergen Reactions   Corn-Containing Products Diarrhea and Other (See Comments)   Gluten Meal Nausea And Vomiting and Other (See Comments)   Versed [Midazolam] Other (See Comments)    Combative, bad mood   "

## 2024-07-09 NOTE — Assessment & Plan Note (Signed)
 increase hydration and check labs

## 2024-07-09 NOTE — Assessment & Plan Note (Signed)
 Patient encouraged to maintain heart healthy diet, regular exercise, adequate sleep. Consider daily probiotics. Take medications as prescribed. Declines flu shot. Colonoscopy 2021 due in 2026. Labs ordered and reviewed.

## 2024-07-10 ENCOUNTER — Ambulatory Visit: Payer: BC Managed Care – PPO | Admitting: Family Medicine

## 2024-07-10 ENCOUNTER — Other Ambulatory Visit

## 2024-07-10 ENCOUNTER — Encounter: Payer: Self-pay | Admitting: Family Medicine

## 2024-07-10 VITALS — BP 128/80 | HR 67 | Temp 97.6°F | Resp 16 | Ht 68.0 in | Wt 170.4 lb

## 2024-07-10 DIAGNOSIS — Z125 Encounter for screening for malignant neoplasm of prostate: Secondary | ICD-10-CM

## 2024-07-10 DIAGNOSIS — Z0000 Encounter for general adult medical examination without abnormal findings: Secondary | ICD-10-CM

## 2024-07-10 DIAGNOSIS — E785 Hyperlipidemia, unspecified: Secondary | ICD-10-CM

## 2024-07-10 DIAGNOSIS — R252 Cramp and spasm: Secondary | ICD-10-CM

## 2024-07-10 DIAGNOSIS — Z23 Encounter for immunization: Secondary | ICD-10-CM | POA: Diagnosis not present

## 2024-07-10 LAB — COMPREHENSIVE METABOLIC PANEL WITH GFR
ALT: 20 U/L (ref 3–53)
AST: 23 U/L (ref 5–37)
Albumin: 4.5 g/dL (ref 3.5–5.2)
Alkaline Phosphatase: 46 U/L (ref 39–117)
BUN: 19 mg/dL (ref 6–23)
CO2: 28 meq/L (ref 19–32)
Calcium: 9.2 mg/dL (ref 8.4–10.5)
Chloride: 103 meq/L (ref 96–112)
Creatinine, Ser: 1.28 mg/dL (ref 0.40–1.50)
GFR: 61.13 mL/min
Glucose, Bld: 89 mg/dL (ref 70–99)
Potassium: 4.3 meq/L (ref 3.5–5.1)
Sodium: 139 meq/L (ref 135–145)
Total Bilirubin: 0.4 mg/dL (ref 0.2–1.2)
Total Protein: 6.6 g/dL (ref 6.0–8.3)

## 2024-07-10 LAB — CBC WITH DIFFERENTIAL/PLATELET
Basophils Absolute: 0 K/uL (ref 0.0–0.1)
Basophils Relative: 0.5 % (ref 0.0–3.0)
Eosinophils Absolute: 0.1 K/uL (ref 0.0–0.7)
Eosinophils Relative: 1.8 % (ref 0.0–5.0)
HCT: 38.8 % — ABNORMAL LOW (ref 39.0–52.0)
Hemoglobin: 13 g/dL (ref 13.0–17.0)
Lymphocytes Relative: 44.2 % (ref 12.0–46.0)
Lymphs Abs: 2.2 K/uL (ref 0.7–4.0)
MCHC: 33.5 g/dL (ref 30.0–36.0)
MCV: 93.2 fl (ref 78.0–100.0)
Monocytes Absolute: 0.6 K/uL (ref 0.1–1.0)
Monocytes Relative: 12.8 % — ABNORMAL HIGH (ref 3.0–12.0)
Neutro Abs: 2 K/uL (ref 1.4–7.7)
Neutrophils Relative %: 40.7 % — ABNORMAL LOW (ref 43.0–77.0)
Platelets: 248 K/uL (ref 150.0–400.0)
RBC: 4.16 Mil/uL — ABNORMAL LOW (ref 4.22–5.81)
RDW: 13.9 % (ref 11.5–15.5)
WBC: 5 K/uL (ref 4.0–10.5)

## 2024-07-10 LAB — LIPID PANEL
Cholesterol: 244 mg/dL — ABNORMAL HIGH (ref 28–200)
HDL: 57 mg/dL
LDL Cholesterol: 162 mg/dL — ABNORMAL HIGH (ref 10–99)
NonHDL: 186.99
Total CHOL/HDL Ratio: 4
Triglycerides: 123 mg/dL (ref 10.0–149.0)
VLDL: 24.6 mg/dL (ref 0.0–40.0)

## 2024-07-10 LAB — PSA: PSA: 2.09 ng/mL (ref 0.10–4.00)

## 2024-07-10 LAB — TSH: TSH: 2.09 u[IU]/mL (ref 0.35–5.50)

## 2024-07-10 NOTE — Patient Instructions (Addendum)
 Shingrix is the new shingles shot, 2 shots over 2-6 months, confirm coverage with insurance and document, then can return here for shots with nurse appt or at pharmacy   CBT insomnia, APP by [employer]  L Tryptophan capsule for sleep   Preventive Care 37-59 Years Old, Male Preventive care refers to lifestyle choices and visits with your health care provider that can promote health and wellness. Preventive care visits are also called wellness exams. What can I expect for my preventive care visit? Counseling During your preventive care visit, your health care provider may ask about your: Medical history, including: Past medical problems. Family medical history. Current health, including: Emotional well-being. Home life and relationship well-being. Sexual activity. Lifestyle, including: Alcohol, nicotine or tobacco, and drug use. Access to firearms. Diet, exercise, and sleep habits. Safety issues such as seatbelt and bike helmet use. Sunscreen use. Work and work astronomer. Physical exam Your health care provider will check your: Height and weight. These may be used to calculate your BMI (body mass index). BMI is a measurement that tells if you are at a healthy weight. Waist circumference. This measures the distance around your waistline. This measurement also tells if you are at a healthy weight and may help predict your risk of certain diseases, such as type 2 diabetes and high blood pressure. Heart rate and blood pressure. Body temperature. Skin for abnormal spots. What immunizations do I need?  Vaccines are usually given at various ages, according to a schedule. Your health care provider will recommend vaccines for you based on your age, medical history, and lifestyle or other factors, such as travel or where you work. What tests do I need? Screening Your health care provider may recommend screening tests for certain conditions. This may include: Lipid and  cholesterol levels. Diabetes screening. This is done by checking your blood sugar (glucose) after you have not eaten for a while (fasting). Hepatitis B test. Hepatitis C test. HIV (human immunodeficiency virus) test. STI (sexually transmitted infection) testing, if you are at risk. Lung cancer screening. Prostate cancer screening. Colorectal cancer screening. Talk with your health care provider about your test results, treatment options, and if necessary, the need for more tests. Follow these instructions at home: Eating and drinking  Eat a diet that includes fresh fruits and vegetables, whole grains, lean protein, and low-fat dairy products. Take vitamin and mineral supplements as recommended by your health care provider. Do not drink alcohol if your health care provider tells you not to drink. If you drink alcohol: Limit how much you have to 0-2 drinks a day. Know how much alcohol is in your drink. In the [place] one drink equals one 12 oz bottle of beer (355 mL), one 5 oz glass of wine (148 mL), or one 1 oz glass of hard liquor (44 mL). Lifestyle Brush your teeth every morning and night with fluoride toothpaste. Floss one time each day. Exercise for at least 30 minutes 5 or more days each week. Do not use any products that contain nicotine or tobacco. These products include cigarettes, chewing tobacco, and vaping devices, such as e-cigarettes. If you need help quitting, ask your health care provider. Do not use drugs. If you are sexually active, practice safe sex. Use a condom or other form of protection to prevent STIs. Take aspirin only as told by your health care provider. Make sure that you understand how much to take and what form to take. Work with your health care provider to find out  whether it is safe and beneficial for you to take aspirin daily. Find healthy ways to manage stress, such as: Meditation, yoga, or listening to music. Journaling. Talking to a trusted  person. Spending time with friends and family. Minimize exposure to UV radiation to reduce your risk of skin cancer. Safety Always wear your seat belt while driving or riding in a vehicle. Do not drive: If you have been drinking alcohol. Do not ride with someone who has been drinking. When you are tired or distracted. While texting. If you have been using any mind-altering substances or drugs. Wear a helmet and other protective equipment during sports activities. If you have firearms in your house, make sure you follow all gun safety procedures. What's next? Go to your health care provider once a year for an annual wellness visit. Ask your health care provider how often you should have your eyes and teeth checked. Stay up to date on all vaccines. This information is not intended to replace advice given to you by your health care provider. Make sure you discuss any questions you have with your health care provider. Document Revised: 01/01/2021 Document Reviewed: 01/01/2021 Elsevier Patient Education  2024 [employer].

## 2024-07-11 ENCOUNTER — Ambulatory Visit: Payer: Self-pay | Admitting: Family Medicine

## 2024-07-11 NOTE — Progress Notes (Signed)
Pt reviewed via Mychart 

## 2025-07-16 ENCOUNTER — Encounter: Admitting: Family Medicine
